# Patient Record
Sex: Female | Born: 1985 | Race: White | Hispanic: No | Marital: Married | Smoking: Never smoker
Health system: Southern US, Community
[De-identification: ages and names within clinical notes are randomized; demographics above are authoritative.]

## PROBLEM LIST (undated history)

## (undated) DIAGNOSIS — D126 Benign neoplasm of colon, unspecified: Secondary | ICD-10-CM

## (undated) DIAGNOSIS — K589 Irritable bowel syndrome without diarrhea: Secondary | ICD-10-CM

## (undated) DIAGNOSIS — N390 Urinary tract infection, site not specified: Secondary | ICD-10-CM

## (undated) DIAGNOSIS — IMO0001 Reserved for inherently not codable concepts without codable children: Secondary | ICD-10-CM

## (undated) DIAGNOSIS — K3184 Gastroparesis: Secondary | ICD-10-CM

## (undated) DIAGNOSIS — K219 Gastro-esophageal reflux disease without esophagitis: Secondary | ICD-10-CM

## (undated) DIAGNOSIS — N809 Endometriosis, unspecified: Secondary | ICD-10-CM

## (undated) DIAGNOSIS — K2970 Gastritis, unspecified, without bleeding: Secondary | ICD-10-CM

## (undated) HISTORY — DX: Gastro-esophageal reflux disease without esophagitis: K21.9

## (undated) HISTORY — DX: Irritable bowel syndrome, unspecified: K58.9

## (undated) HISTORY — PX: OTHER SURGICAL HISTORY: SHX169

## (undated) HISTORY — DX: Reserved for inherently not codable concepts without codable children: IMO0001

## (undated) HISTORY — DX: Benign neoplasm of colon, unspecified: D12.6

## (undated) HISTORY — DX: Urinary tract infection, site not specified: N39.0

---
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
## 2011-10-27 LAB — CBC WITH DIFFERENTIAL/PLATELET
HCT: 41 %
Hemoglobin: 14.2 g/dL (ref 12.0–16.0)
MCV: 86 fL
Platelets: 220 K/µL
WBC: 7.3

## 2011-10-27 LAB — COMPREHENSIVE METABOLIC PANEL
ALT: 14 U/L (ref 7–35)
AST: 17 U/L
Albumin: 4.2
BUN: 13 mg/dL (ref 4–21)
Creat: 0.65
Glucose: 89 mg/dL
Lipase: 22 units/L (ref 0–53)
Sodium: 139 mmol/L (ref 137–147)

## 2011-12-30 ENCOUNTER — Ambulatory Visit: Payer: Self-pay | Admitting: Gastroenterology

## 2012-01-04 ENCOUNTER — Ambulatory Visit (INDEPENDENT_AMBULATORY_CARE_PROVIDER_SITE_OTHER): Payer: BC Managed Care – PPO | Admitting: Gastroenterology

## 2012-01-04 ENCOUNTER — Encounter: Payer: Self-pay | Admitting: Gastroenterology

## 2012-01-04 VITALS — BP 120/81 | HR 99 | Temp 99.0°F | Ht 66.0 in | Wt 148.8 lb

## 2012-01-04 DIAGNOSIS — R197 Diarrhea, unspecified: Secondary | ICD-10-CM

## 2012-01-04 DIAGNOSIS — R1110 Vomiting, unspecified: Secondary | ICD-10-CM

## 2012-01-04 DIAGNOSIS — R634 Abnormal weight loss: Secondary | ICD-10-CM

## 2012-01-04 DIAGNOSIS — R1030 Lower abdominal pain, unspecified: Secondary | ICD-10-CM | POA: Insufficient documentation

## 2012-01-04 DIAGNOSIS — R109 Unspecified abdominal pain: Secondary | ICD-10-CM

## 2012-01-04 DIAGNOSIS — R1013 Epigastric pain: Secondary | ICD-10-CM

## 2012-01-04 LAB — CBC WITH DIFFERENTIAL/PLATELET
Basophils Relative: 0 % (ref 0–1)
Eosinophils Absolute: 0 10*3/uL (ref 0.0–0.7)
Eosinophils Relative: 1 % (ref 0–5)
Hemoglobin: 13.7 g/dL (ref 12.0–15.0)
Lymphs Abs: 2.5 10*3/uL (ref 0.7–4.0)
MCH: 29.3 pg (ref 26.0–34.0)
MCHC: 34.7 g/dL (ref 30.0–36.0)
MCV: 84.6 fL (ref 78.0–100.0)
Monocytes Absolute: 0.4 10*3/uL (ref 0.1–1.0)
Monocytes Relative: 5 % (ref 3–12)
Neutrophils Relative %: 60 % (ref 43–77)
RBC: 4.67 MIL/uL (ref 3.87–5.11)

## 2012-01-04 LAB — TSH: TSH: 2.095 u[IU]/mL (ref 0.350–4.500)

## 2012-01-04 LAB — HEPATIC FUNCTION PANEL
AST: 20 U/L (ref 0–37)
Alkaline Phosphatase: 42 U/L (ref 39–117)
Bilirubin, Direct: 0.1 mg/dL (ref 0.0–0.3)
Total Bilirubin: 0.4 mg/dL (ref 0.3–1.2)

## 2012-01-04 MED ORDER — DEXLANSOPRAZOLE 60 MG PO CPDR: 60.0000 mg | DELAYED_RELEASE_CAPSULE | Freq: Every day | ORAL | Status: DC

## 2012-01-04 MED ORDER — DICYCLOMINE HCL 10 MG PO CAPS: 10.0000 mg | ORAL_CAPSULE | Freq: Three times a day (TID) | ORAL | Status: DC

## 2012-01-04 NOTE — Progress Notes (Signed)
Faxed to PCP

## 2012-01-04 NOTE — Assessment & Plan Note (Signed)
N/V/D associated with epigastric and lower abd pain which has persisted for two months. Symptoms began about two weeks after taking Bactrim for possible UTI. Patient also with dysuria, urinary urgency, microscopic hematuria but last urine culture negative. ?interstial cystitis as tends to be present along with IBS? Encourage patient to see urologist.   Discussed with patient. Her N/V/D and abd pain may be infectious etiology (need to rule out CDiff and other pathogens), post-infectious IBS, inflammatory bowel disease. Less likely biliary. Would advise to check stool studies, ifobt. Recheck CBC with diff, LFTs, screen for Celiac.  Start Dexilant 60mg  daily. Samples provided. Stop omeprazole. Start Bentyl qac. Hold for urinary hesitancy or constipation.   Will touch base with her in two weeks and after results available. She may eventually need EGD/TCS/TI.

## 2012-01-04 NOTE — Progress Notes (Signed)
Primary Care Physician:  Ninfa Linden, NP  Primary Gastroenterologist:  Jonette Eva, MD   Chief Complaint  Patient presents with  . Nausea  . Emesis    HPI:  Hailey Parker is a 26 y.o. female here for new patient visit, self referral, for further evaluation of persistent N/V/D of two months duration.   In 09/2011, patient developed urinary urgency/frequency, dysuria. H/O multiple UTIs in past. Saw PCP. Had moderate blood in urine. Treated with Bactrim. Urine culture was negative. Also had pelvic u/s which was unremarkable. Two weeks later she developed N/V/D. Urinary symptoms persist. N/V/D lasted for two weeks. Noncontrast CT done for left adnexal pain and hematuria. CT unremarkable. PCP thought N/V/D due to IBS. When symptoms persistent, patient reevaluate and diagnosed with Norovirus. Labs showed unremarkable CBC, CMET, lipase.   Slowly been able to add back to diet but very slow. Weight down about one full size of clothing, 10 pounds. If she eats anything red (strawberries, red drinks) seems to followed by N/V. Within 5-10 minutes will vomit with certain foods (chicken, milk, soy milk, wheat bread, anything salty or sweet, ice in drinks). Tolerates [place] meat only. Doesn't tolerate other meats except occasional roast beef. White starches, [place] no diarrhea. Chronic lactose sensitive. Usually tolerates soy milk but not anymore. Tolerates bananas, room temp water, white bread, low salt chips/popcorn/pretzels, saltines.   Has h/o IBS but usually able to control with food avoidance. Has never had significant diarrhea or urgency. At baseline, BM 2 daily. Now 4-5 daily with rare solid food. No nocturnal symptoms. Used to eat salads daily, but now lettuce causes N/V/D. No blood in stool. Doesn't eat within three hours of bedtime. Omeprazole lately. Burning/burps, pain in chest seems to help some. No meds at all for diarrhea.  C/O pp lower abdominal pain.    Current Outpatient Prescriptions    Medication Sig Dispense Refill  .  omeprazole  20mg  daily prn     .       Marland Kitchen NON FORMULARY nortrell 777   As directed      . NON FORMULARY OTC allergy    One daily as needed        Allergies as of 01/04/2012 - Review Complete 01/04/2012  Allergen Reaction Noted  . Cefzil (cefprozil) Swelling 01/04/2012    Past Medical History  Diagnosis Date  . Reflux   . UTI (urinary tract infection)     Past Surgical History  Procedure Date  . Bilateral ear tubes 1993/1997  . Adenoidectomy 1997    Family History  Problem Relation Age of Onset  . Heart attack Paternal Grandfather     age 39  . Arthritis Paternal Grandmother     RA  . Inflammatory bowel disease Neg Hx     History   Social History  . Marital Status: Married    Spouse Name: N/A    Number of Children: 0  . 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 Smoking status: Never Smoker   . Smokeless tobacco: Not on file  . Alcohol Use: Yes     only one glass of beer in 2 months  . Drug Use: No  . Sexually Active: Not on file   Other Topics Concern  . Not on file   Social History Narrative   Husband, Hailey Parker, patient here.      ROS:  General: see HPI. C/O fatigue, weakness.  Eyes: Negative for vision changes.  ENT: Negative for hoarseness, difficulty swallowing , nasal congestion. CV: Negative for chest pain, angina, palpitations, dyspnea on exertion, peripheral edema.  Respiratory: Negative for dyspnea at rest, dyspnea on exertion, cough, sputum, wheezing.  GI: See history of present illness. GU:  Positive for dysuria, microscopic hematuria, urinary frequency.   MS: Negative for joint pain, low back pain.  Derm: Negative for rash or itching.  Neuro: Negative for weakness, abnormal sensation, seizure, frequent headaches, memory loss, confusion.  Psych: Negative for anxiety, depression, suicidal ideation, hallucinations.  Endo: see hpi.  Heme:  Negative for bruising or bleeding. Allergy: Negative for rash or hives.    Physical Examination:  BP 120/81  Pulse 99  Temp 99 F (37.2 C) (Tympanic)  Ht 5\' 6"  (1.676 m)  Wt 148 lb 12.8 oz (67.495 kg)  BMI 24.02 kg/m2  LMP 12/22/2011   General: Well-nourished, well-developed in no acute distress.  Head: Normocephalic, atraumatic.   Eyes: Conjunctiva pink, no icterus. Mouth: Oropharyngeal mucosa moist and pink , no lesions erythema or exudate. Neck: Supple without thyromegaly, masses, or lymphadenopathy.  Lungs: Clear to auscultation bilaterally.  Heart: Regular rate and rhythm, no murmurs rubs or gallops.  Abdomen: Bowel sounds are normal, mild epig/lower abd tenderess, nondistended, no hepatosplenomegaly or masses, no abdominal bruits or    hernia , no rebound or guarding.   Rectal: not performed Extremities: No lower extremity edema. No clubbing or deformities.  Neuro: Alert and oriented x 4 , grossly normal neurologically.  Skin: Warm and dry, no rash or jaundice.   Psych: Alert and cooperative, normal mood and affect.  Labs: Labs from 10/27/2011. White blood cell count 7300, hemoglobin 14.2, hematocrit 41.4, MCV 86, platelets 220,000, glucose 89, BUN 13, creatinine 0.65, sodium 139, potassium 4.3, calcium 8.7, albumin 4.2, total bilirubin 0.3, alkaline phosphatase 54, AST 17, ALT 14, lipase 22.  Imaging Studies:   CT of the and pelvis without contrast on 10/26/2011 negative for obstructive uropathy.  Pelvic ultrasound 10/14/2011 showed normal-appearing uterus. Right ovary appeared normal with small follicles. Left ovary cannot be visualized. Small amount of fluid in the endocervical canal.

## 2012-01-05 LAB — IGA: IgA: 248 mg/dL (ref 69–380)

## 2012-01-08 NOTE — Progress Notes (Signed)
Quick Note:  Labs look great. Remind patient about stool studies. See how she is doing? Diarrhea, vomiting, abd pain? ______

## 2012-01-11 LAB — CLOSTRIDIUM DIFFICILE BY PCR: Toxigenic C. Difficile by PCR: NOT DETECTED

## 2012-01-11 NOTE — Progress Notes (Signed)
Quick Note:  LMOM for pt to call. ( Stool studies have been done ) ______

## 2012-01-11 NOTE — Progress Notes (Signed)
Quick Note:  Pt returned call. Said she has learned what she can eat, which is mostly [place] and bread. She does very well if she sticks to that. If she does anything else she will have diarrhea about 3 times a day. No vomiting. Her abdominal pain is intermittent and mostly when she eats something different, but not very bad. Please advise! ______

## 2012-01-12 LAB — STOOL CULTURE

## 2012-01-18 NOTE — Progress Notes (Signed)
Quick Note:  Stool test all negative. EGD/TCS to be scheduled. See addendum to OV note. ______

## 2012-01-18 NOTE — Progress Notes (Signed)
LMOM to call.

## 2012-01-18 NOTE — Progress Notes (Signed)
See [facility] recommendations below.  Schedule TCS/EGD FOR ABD PAIN, VOMITING, AND DIARRHEA.

## 2012-01-18 NOTE — Progress Notes (Signed)
Called to schedule patient for procedure she advised me that she will call me back tomorrow when she can get to her calender shes at the pool and she doesn't have her calender

## 2012-01-18 NOTE — Progress Notes (Signed)
MOST LIKELY IBS, but proceed with TCS/EGD FOR ABD PAIN, VOMITING, AND DIARRHEA.

## 2012-01-25 ENCOUNTER — Encounter (HOSPITAL_COMMUNITY): Payer: Self-pay | Admitting: Pharmacy Technician

## 2012-01-25 ENCOUNTER — Other Ambulatory Visit: Payer: Self-pay | Admitting: Gastroenterology

## 2012-01-25 DIAGNOSIS — R109 Unspecified abdominal pain: Secondary | ICD-10-CM

## 2012-01-25 DIAGNOSIS — R197 Diarrhea, unspecified: Secondary | ICD-10-CM

## 2012-01-25 DIAGNOSIS — R1110 Vomiting, unspecified: Secondary | ICD-10-CM

## 2012-01-25 MED ORDER — PEG-KCL-NACL-NASULF-NA ASC-C 100 G PO SOLR: 1.0000 | Freq: Once | ORAL | Status: DC

## 2012-01-25 NOTE — Progress Notes (Signed)
Please f/u on this. Looks like she never got scheduled.

## 2012-01-25 NOTE — Progress Notes (Signed)
Hailey Parker is scheduled with [facility] on 07/30 at 12:15 instructions and prep Rx was mailed to the patient and patient is aware

## 2012-02-08 ENCOUNTER — Other Ambulatory Visit: Payer: Self-pay | Admitting: Gastroenterology

## 2012-02-08 MED ORDER — PEG-KCL-NACL-NASULF-NA ASC-C 100 G PO SOLR: 1.0000 | Freq: Once | ORAL | Status: DC

## 2012-02-09 ENCOUNTER — Encounter (HOSPITAL_COMMUNITY)
Admission: RE | Disposition: A | Discharge status: Home / Self Care | Payer: Self-pay | Source: Ambulatory Visit | Attending: Gastroenterology

## 2012-02-09 ENCOUNTER — Encounter (HOSPITAL_COMMUNITY): Payer: Self-pay | Admitting: *Deleted

## 2012-02-09 ENCOUNTER — Ambulatory Visit (HOSPITAL_COMMUNITY)
Admission: RE | Admit: 2012-02-09 | Discharge: 2012-02-09 | Disposition: A | Discharge status: Home / Self Care | Payer: BC Managed Care – PPO | Source: Ambulatory Visit | Attending: Gastroenterology | Admitting: Gastroenterology

## 2012-02-09 DIAGNOSIS — R109 Unspecified abdominal pain: Secondary | ICD-10-CM | POA: Insufficient documentation

## 2012-02-09 DIAGNOSIS — K648 Other hemorrhoids: Secondary | ICD-10-CM | POA: Insufficient documentation

## 2012-02-09 DIAGNOSIS — R634 Abnormal weight loss: Secondary | ICD-10-CM | POA: Insufficient documentation

## 2012-02-09 DIAGNOSIS — K2990 Gastroduodenitis, unspecified, without bleeding: Secondary | ICD-10-CM

## 2012-02-09 DIAGNOSIS — R112 Nausea with vomiting, unspecified: Secondary | ICD-10-CM

## 2012-02-09 DIAGNOSIS — K2970 Gastritis, unspecified, without bleeding: Secondary | ICD-10-CM

## 2012-02-09 DIAGNOSIS — R1110 Vomiting, unspecified: Secondary | ICD-10-CM

## 2012-02-09 DIAGNOSIS — R1032 Left lower quadrant pain: Secondary | ICD-10-CM

## 2012-02-09 DIAGNOSIS — R197 Diarrhea, unspecified: Secondary | ICD-10-CM

## 2012-02-09 DIAGNOSIS — D126 Benign neoplasm of colon, unspecified: Secondary | ICD-10-CM

## 2012-02-09 DIAGNOSIS — K2940 Chronic atrophic gastritis without bleeding: Secondary | ICD-10-CM | POA: Insufficient documentation

## 2012-02-09 HISTORY — PX: ESOPHAGOGASTRODUODENOSCOPY: SHX1529

## 2012-02-09 HISTORY — PX: OTHER SURGICAL HISTORY: SHX169

## 2012-02-09 SURGERY — COLONOSCOPY WITH ESOPHAGOGASTRODUODENOSCOPY (EGD)
Anesthesia: Moderate Sedation

## 2012-02-09 MED ORDER — PROMETHAZINE HCL 25 MG/ML IJ SOLN
INTRAMUSCULAR | Status: DC | PRN
  Administered 2012-02-09: 12.5 mg via INTRAVENOUS

## 2012-02-09 MED ORDER — SODIUM CHLORIDE 0.45 % IV SOLN
Freq: Once | INTRAVENOUS | Status: AC
  Administered 2012-02-09: 1000 mL via INTRAVENOUS

## 2012-02-09 MED ORDER — DEXLANSOPRAZOLE 60 MG PO CPDR: DELAYED_RELEASE_CAPSULE | ORAL | Status: DC

## 2012-02-09 MED ORDER — STERILE WATER FOR IRRIGATION IR SOLN
Status: DC | PRN
  Administered 2012-02-09: 11:00:00

## 2012-02-09 MED ORDER — SODIUM CHLORIDE 0.9 % IJ SOLN
INTRAMUSCULAR | Status: AC
  Filled 2012-02-09: qty 10

## 2012-02-09 MED ORDER — MIDAZOLAM HCL 5 MG/5ML IJ SOLN
INTRAMUSCULAR | Status: DC | PRN
  Administered 2012-02-09 (×3): 1 mg via INTRAVENOUS
  Administered 2012-02-09: 2 mg via INTRAVENOUS

## 2012-02-09 MED ORDER — MEPERIDINE HCL 100 MG/ML IJ SOLN
INTRAMUSCULAR | Status: AC
  Filled 2012-02-09: qty 2

## 2012-02-09 MED ORDER — MEPERIDINE HCL 100 MG/ML IJ SOLN
INTRAMUSCULAR | Status: DC | PRN
  Administered 2012-02-09: 50 mg via INTRAVENOUS
  Administered 2012-02-09: 25 mg via INTRAVENOUS

## 2012-02-09 MED ORDER — PROMETHAZINE HCL 25 MG/ML IJ SOLN
INTRAMUSCULAR | Status: AC
  Filled 2012-02-09: qty 1

## 2012-02-09 MED ORDER — MIDAZOLAM HCL 5 MG/5ML IJ SOLN
INTRAMUSCULAR | Status: AC
  Filled 2012-02-09: qty 10

## 2012-02-09 NOTE — H&P (Signed)
  Primary Care Physician:  Ninfa Linden, NP Primary Gastroenterologist:  Dr. Darrick Penna  Pre-Procedure History & Physical: HPI:  Hailey Parker is a 26 y.o. female here for NAUSEA/VOMITING/ABDOMINAL PAIN/DIARRHEA.   Past Medical History  Diagnosis Date  . Reflux   . UTI (urinary tract infection)     Past Surgical History  Procedure Date  . Bilateral ear tubes 1993/1997  . Adenoidectomy 1997    Prior to Admission medications   Medication Sig Start Date End Date Taking? Authorizing Provider  dicyclomine (BENTYL) 10 MG capsule Take 1 capsule (10 mg total) by mouth 3 (three) times daily before meals. 01/04/12 01/03/13 Yes Tiffany Kocher, PA  fexofenadine (ALLEGRA) 60 MG tablet Take 60 mg by mouth daily as needed.   Yes Historical Provider, MD  naproxen sodium (ANAPROX) 220 MG tablet Take 220 mg by mouth every 8 (eight) hours as needed. For pain   Yes Historical Provider, MD  norethindrone-ethinyl estradiol (TRIPHASIL,CYCLAFEM,ALYACEN) 0.5/0.75/1-35 MG-MCG tablet Take 1 tablet by mouth daily.   Yes Historical Provider, MD  dexlansoprazole (DEXILANT) 60 MG capsule Take 1 capsule (60 mg total) by mouth daily. 01/04/12 02/03/12  Tiffany Kocher, PA    Allergies as of 01/25/2012 - Review Complete 01/25/2012  Allergen Reaction Noted  . Cefzil (cefprozil) Swelling 01/04/2012    Family History  Problem Relation Age of Onset  . Heart attack Paternal Grandfather     age 16  . Arthritis Paternal Grandmother     RA  . Inflammatory bowel disease Neg Hx     History   Social History  . Marital Status: Married    Spouse Name: N/A    Number of Children: 0  . 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 Smoking status: Never Smoker   . Smokeless tobacco: Not on file  . Alcohol Use: Yes     only one glass of beer in 2 months  . Drug Use: No  . Sexually Active: Not on file   Other Topics Concern  . Not on file   Social History  Narrative   Husband, Hailey Parker, patient here.    Review of Systems: See HPI, otherwise negative ROS   Physical Exam: BP 121/76  Pulse 91  Temp 97 F (36.1 C) (Oral)  Resp 20  Ht 5\' 6"  (1.676 m)  Wt 150 lb (68.04 kg)  BMI 24.21 kg/m2  SpO2 97%  LMP 01/20/2012 General:   Alert,  pleasant and cooperative in NAD Head:  Normocephalic and atraumatic. Neck:  Supple;  Lungs:  Clear throughout to auscultation.    Heart:  Regular rate and rhythm. Abdomen:  Soft, nontender and nondistended. Normal bowel sounds, without guarding, and without rebound.   Neurologic:  Alert and  oriented x4;  grossly normal neurologically.  Impression/Plan:     NAUSEA/VOMTIING/ABDOMINAL PAIN/DIARRHEA  PLAN: TCS/EGD/bX TODAY

## 2012-02-09 NOTE — Op Note (Signed)
[facility] [address] [place], [place]  [address]  ENDOSCOPY PROCEDURE REPORT  PATIENT:  Hailey Parker, Hailey Parker  MR#:  [id number] BIRTHDATE:  03/05/1986, 25 yrs. old  GENDER:  female  ENDOSCOPIST:  Jonette Eva, MD Referred by:  Ninfa Linden, NP  PROCEDURE DATE:  02/09/2012 PROCEDURE:  EGD with biopsy, 62[address] ASA CLASS: INDICATIONS:  abd pain, nausea, vomiting, unintentional weight loss-DENIES STRESS BUT AN BE AN ANXIOUS PERSON. CAN TOLERATE BREAD.  Sx better with dicylomine  MEDICATIONS:   TCS+ Versed 1 mg IV TOPICAL ANESTHETIC:  Cetacaine Spray  DESCRIPTION OF PROCEDURE:     Physical exam was performed. Informed consent was obtained from the patient after explaining the benefits, risks, and alternatives to the procedure.  The patient was connected to the monitor and placed in the left lateral position.  Continuous oxygen was provided by nasal cannula and IV medicine administered through an indwelling cannula.  After administration of sedation, the patient's esophagus was intubated and the EC-3490Li ([id number]) and EG-[id number] ([id number]) endoscope was advanced under direct visualization to the second portion of the duodenum.  The scope was removed slowly by carefully examining the color, texture, anatomy, and integrity of the mucosa on the way out.  The patient was recovered in endoscopy and discharged home in satisfactory condition. <<PROCEDUREIMAGES>>  Mild gastritis was found & BIOPSIED VIA COLD FORCEPS. NL ESOPHAGUS & DUODENUM. BIOPSIES BTAINED VIA COLD FORCEPS TO EVALUATE FOR CELIAC SPRUE.  COMPLICATIONS:    None  ENDOSCOPIC IMPRESSION: 1) Mild gastritis 2) SYMPTOMS MOST LIKELY DUE TO IBS-D  RECOMMENDATIONS: 1) Await biopsy results DEXILANT EVERY AM LOW FAT/HIGH FIBER DIET OPV IN 3 MOS  REPEAT EXAM:  No  ______________________________ Jonette Eva, MD  CC:  n. eSIGNED:   Shephanie Romas at 02/09/2012 02:17 PM  Norina Buzzard, [phone]

## 2012-02-09 NOTE — Op Note (Signed)
[facility] [address] [place], [place]  [address]  COLONOSCOPY PROCEDURE REPORT  PATIENT:  Maguire, Killmer  MR#:  [id number] BIRTHDATE:  Nov 14, 1985, 25 yrs. old  GENDER:  female  ENDOSCOPIST:  Jonette Eva, MD REF. BY:  Ninfa Linden, NP ASSISTANT:  PROCEDURE DATE:  02/09/2012 PROCEDURE:  ILEOColonoscopy with biopsy  INDICATIONS:  abd pain-mainly in LLQ, weight loss, diarrhea, nausea. & vomiting-BETTER AFTER DICYCLOMINE  MEDICATIONS:   Demerol 75 mg IV, Versed 4 mg IV, promethazine (Phenergan) 12.5 mg IV  DESCRIPTION OF PROCEDURE:    Physical exam was performed. Informed consent was obtained from the patient after explaining the benefits, risks, and alternatives to procedure.  The patient was connected to monitor and placed in left lateral position. Continuous oxygen was provided by nasal cannula and IV medicine administered through an indwelling cannula.  After administration of sedation and rectal exam, the patient's rectum was intubated and the EC-3490Li ([id number]) colonoscope was advanced under direct visualization to the ILEUM The scope was removed slowly by carefully examining the color, texture, anatomy, and integrity mucosa on the way out.  The patient was recovered in endoscopy and discharged home in satisfactory condition. <<PROCEDUREIMAGES>>  FINDINGS:  There WAS ONE 3 MM SESSILE polyp identified and removed. in the sigmoid colon VIA COLD FORCEPS.  OTHERWISE Normal ILEUM & colonoscopy without masses, vascular ectasias, or inflammatory changes.  SMALL Internal Hemorrhoids were found. RANDOMIN BIOPSIES OBTAINED TO EVLAUATE FOR MICROSCOPIC COLITIS.  PREP QUALITY: GOOD CECAL W/D TIME:    11 minutes  COMPLICATIONS:    None  ENDOSCOPIC IMPRESSION: 1) Polyp in the sigmoid colon 2) Internal hemorrhoids  RECOMMENDATIONS: 1) Await biopsy results TCS at age 40-mother had advanced polyp  OPV IN NOV 2013  REPEAT EXAM:   No  ______________________________ Jonette Eva, MD  CC:  Ninfa Linden, NP  n. eSIGNEDDuncan Dull Alberta Lenhard at 02/09/2012 12:16 PM  Norina Buzzard, [phone]

## 2012-02-12 ENCOUNTER — Telehealth: Payer: Self-pay | Admitting: Gastroenterology

## 2012-02-12 NOTE — Telephone Encounter (Signed)
Please call pt. HER RANDOM COLON AND SMALL BOWEL BIOPSIES AR NORMAL. She had a simple adenoma removed from her colon. HER stomach Bx shows mild gastritis. HER SYMPTOMS ARE MOST LIKELY DUE TO IBS.   SHE SHOULD Continue DEXILANT & DICYCLOMINE. OPV IN 3 MOS E30 W/ LL. FOLLOW A High fiber diet. TCS in 10 YEARS.

## 2012-02-12 NOTE — Telephone Encounter (Signed)
LMOM to call back

## 2012-02-12 NOTE — Telephone Encounter (Signed)
Recall made 

## 2012-02-12 NOTE — Telephone Encounter (Signed)
Pt is aware of results with no questions

## 2012-02-17 NOTE — Telephone Encounter (Signed)
Pt is aware of OV on 10/30 with LSL and reminder to have tcs in 10 years

## 2012-04-11 ENCOUNTER — Telehealth: Payer: Self-pay | Admitting: Gastroenterology

## 2012-04-11 NOTE — Telephone Encounter (Signed)
Pt called to see if we could call in a generic for Dexilant or something else that is less expensive to [facility] in [place].

## 2012-05-10 ENCOUNTER — Encounter: Payer: Self-pay | Admitting: Gastroenterology

## 2012-05-11 ENCOUNTER — Ambulatory Visit: Payer: BC Managed Care – PPO | Admitting: Gastroenterology

## 2012-05-17 ENCOUNTER — Ambulatory Visit: Payer: BC Managed Care – PPO | Admitting: Urgent Care

## 2012-05-24 ENCOUNTER — Ambulatory Visit: Payer: BC Managed Care – PPO | Admitting: Urgent Care

## 2012-05-31 ENCOUNTER — Encounter: Payer: Self-pay | Admitting: Urgent Care

## 2012-05-31 ENCOUNTER — Ambulatory Visit (INDEPENDENT_AMBULATORY_CARE_PROVIDER_SITE_OTHER): Payer: BC Managed Care – PPO | Admitting: Urgent Care

## 2012-05-31 VITALS — BP 111/71 | HR 89 | Temp 97.6°F | Ht 66.0 in | Wt 142.4 lb

## 2012-05-31 DIAGNOSIS — D126 Benign neoplasm of colon, unspecified: Secondary | ICD-10-CM

## 2012-05-31 DIAGNOSIS — R634 Abnormal weight loss: Secondary | ICD-10-CM

## 2012-05-31 DIAGNOSIS — R1013 Epigastric pain: Secondary | ICD-10-CM

## 2012-05-31 DIAGNOSIS — K589 Irritable bowel syndrome without diarrhea: Secondary | ICD-10-CM

## 2012-05-31 MED ORDER — DICYCLOMINE HCL 10 MG PO CAPS: 10.0000 mg | ORAL_CAPSULE | Freq: Three times a day (TID) | ORAL | Status: DC

## 2012-05-31 NOTE — Patient Instructions (Addendum)
Fiber supplement of choice daily(Benefiber, fiber choice, or Metamucil) Continue probiotics daily Use Bentyl 10 mg before each meal and bedtime (4 times per day) as needed for diarrhea Continue to monitor your weight. If you continue to lose weight, you will need to be seen sooner than 6 months Otherwise office visit in 6 months with Dr. Darrick Penna Diet and Irritable Bowel Syndrome  No cure has been found for irritable bowel syndrome (IBS). Many options are available to treat the symptoms. Your caregiver will give you the best treatments available for your symptoms. He or she will also encourage you to manage stress and to make changes to your diet. You need to work with your caregiver and Registered Dietician to find the best combination of medicine, diet, counseling, and support to control your symptoms. The following are some diet suggestions. FOODS THAT MAKE IBS WORSE  Fatty foods, such as [place] fries.  Milk products, such as cheese or ice cream.  Chocolate.  Alcohol.  Caffeine (found in coffee and some sodas).  Carbonated drinks, such as soda. If certain foods cause symptoms, you should eat less of them or stop eating them. FOOD JOURNAL   Keep a journal of the foods that seem to cause distress. Write down:  What you are eating during the day and when.  What problems you are having after eating.  When the symptoms occur in relation to your meals.  What foods always make you feel badly.  Take your notes with you to your caregiver to see if you should stop eating certain foods. FOODS THAT MAKE IBS BETTER Fiber reduces IBS symptoms, especially constipation, because it makes stools soft, bulky, and easier to pass. Fiber is found in bran, bread, cereal, beans, fruit, and vegetables. Examples of foods with fiber include:  Apples.  Peaches.  Pears.  Berries.  Figs.  Broccoli, raw.  Cabbage.  Carrots.  Raw peas.  Kidney beans.  Lima beans.  Whole-grain  bread.  Whole-grain cereal. Add foods with fiber to your diet a little at a time. This will let your body get used to them. Too much fiber at once might cause gas and swelling of your abdomen. This can trigger symptoms in a person with IBS. Caregivers usually recommend a diet with enough fiber to produce soft, painless bowel movements. High fiber diets may cause gas and bloating. However, these symptoms often go away within a few weeks, as your body adjusts. In many cases, dietary fiber may lessen IBS symptoms, particularly constipation. However, it may not help pain or diarrhea. High fiber diets keep the colon mildly enlarged (distended) with the added fiber. This may help prevent spasms in the colon. Some forms of fiber also keep water in the stool, thereby preventing hard stools that are difficult to pass.  Besides telling you to eat more foods with fiber, your caregiver may also tell you to get more fiber by taking a fiber pill or drinking water mixed with a special high fiber powder. An example of this is a natural fiber laxative containing psyllium seed.  TIPS  Large meals can cause cramping and diarrhea in people with IBS. If this happens to you, try eating 4 or 5 small meals a day, or try eating less at each of your usual 3 meals. It may also help if your meals are low in fat and high in carbohydrates. Examples of carbohydrates are pasta, rice, whole-grain breads and cereals, fruits, and vegetables.  If dairy products cause your symptoms to flare  up, you can try eating less of those foods. You might be able to handle yogurt better than other dairy products, because it contains bacteria that helps with digestion. Dairy products are an important source of calcium and other nutrients. If you need to avoid dairy products, be sure to talk with a Registered Dietitian about getting these nutrients through other food sources.  Drink enough water and fluids to keep your urine clear or pale yellow. This is  important, especially if you have diarrhea. FOR MORE INFORMATION  International Foundation for Functional Gastrointestinal Disorders: www.iffgd.org  National Digestive Diseases Information Clearinghouse: digestive.StageSync.si Document Released: 09/19/2003 Document Revised: 09/21/2011 Document Reviewed: 06/06/2007 [facility] Patient Information 2013 [place], [place].

## 2012-05-31 NOTE — Progress Notes (Addendum)
Primary Care Physician:  Ninfa Linden, FNP Primary Gastroenterologist:  Dr. Jonette Eva  Chief Complaint  Patient presents with  . Follow-up    IBS    HPI:  Hailey Parker is a 26 y.o. female here for follow up for IBS.  She has been taking OTC probiotics daily cannot remember the name.  She is off dexilant now as it was too expensive.  She denies any problems with acid reflux.  Denies nausea, vomiting, dysphagia, odynophagia or anorexia.  She is a high Primary school teacher.  She has been having post-prandial urgency with diarrhea daily, but bentyl does help a lot.  She is "learning to manage her symptoms".  She tells me she has continued to lose wieght due to dietary discretions.  She can only eat organic foods, nothing out of a can.  Really no rhyme or reason to foods she can eat.  Her clothing size went from 10-6 since Spring.  She is eating more carbs due to no issues with digestion.  Meats, fruits, veggies make her sick.  We have a document weight loss of 1# per month.  He reports a good appetite.  TSH, TTG IgA normal.  Frequently sees GYN for UTI-like symptoms & urologist.  Previous work-up has included endoscopies as below, CT scan A/[facility] at [facility] or [facility], & MRI abdomen. She is taking bentyl before each meal with good results. Past Medical History  Diagnosis Date  . Reflux   . UTI (urinary tract infection)   . IBS (irritable bowel syndrome)   . Tubular adenoma of colon     Past Surgical History  Procedure Date  . Bilateral ear tubes 1993/1997  . Adenoidectomy 1997  . Ileocolonoscopy 02/09/2012    Fields:tubular adenoma removed from the sigmoid colon/Internal hemorrhoids  . Esophagogastroduodenoscopy 02/09/2012    Fields:Mild gastritis/SYMPTOMS MOST LIKELY DUE TO IBS-D    Current Outpatient Prescriptions  Medication Sig Dispense Refill  . fexofenadine (ALLEGRA) 60 MG tablet Take 60 mg by mouth daily as needed.      Aldean Ast 0.1-20 MG-MCG tablet Take 1  tablet by mouth Daily.      . naproxen sodium (ANAPROX) 220 MG tablet Take 220 mg by mouth every 8 (eight) hours as needed. For pain      . NON FORMULARY Probiotic    Once capsule daily      . dexlansoprazole (DEXILANT) 60 MG capsule Take 1 capsule (60 mg total) by mouth daily.  30 capsule  0  . dicyclomine (BENTYL) 10 MG capsule Take 1 capsule (10 mg total) by mouth 4 (four) times daily -  before meals and at bedtime.  120 capsule  5    Allergies as of 05/31/2012 - Review Complete 05/31/2012  Allergen Reaction Noted  . Cefzil (cefprozil) Swelling 01/04/2012    Review of Systems: Gen: Denies any fever, chills, sweats, anorexia, fatigue, weakness, malaise, weight loss, and sleep disorder CV: Denies chest pain, angina, palpitations, syncope, orthopnea, PND, peripheral edema, and claudication. Resp: Denies dyspnea at rest, dyspnea with exercise, cough, sputum, wheezing, coughing up blood, and pleurisy. GI: Denies vomiting blood, jaundice, and fecal incontinence.   Denies dysphagia or odynophagia. Derm: Denies rash, itching, dry skin, hives, moles, warts, or unhealing ulcers.  Psych: Denies depression, anxiety, memory loss, suicidal ideation, hallucinations, paranoia, and confusion. Heme: Denies bruising, bleeding, and enlarged lymph nodes.  Physical Exam: BP 111/71  Pulse 89  Temp 97.6 F (36.4 C) (Tympanic)  Ht 5\' 6"  (1.676 m)  Wt 142 lb 6.4 oz (64.592 kg)  BMI 22.98 kg/m2  LMP 05/10/2012 General:   Alert,  Well-developed, well-nourished, pleasant and cooperative in NAD Eyes:  Sclera clear, no icterus.   Conjunctiva pink. Mouth:  No deformity or lesions, oropharynx pink and moist. Neck:  Supple; no masses or thyromegaly. Heart:  Regular rate and rhythm; no murmurs, clicks, rubs,  or gallops. Abdomen:  Normal bowel sounds.  No bruits.  Soft, non-tender and non-distended without masses, hepatosplenomegaly or hernias noted.  No guarding or rebound tenderness.   Rectal:  Deferred.    Msk:  Symmetrical without gross deformities.  Pulses:  Normal pulses noted. Extremities:  No clubbing or edema. Neurologic:  Alert and oriented x4;  grossly normal neurologically. Skin:  Intact without significant lesions or rashes.

## 2012-06-03 ENCOUNTER — Encounter: Payer: Self-pay | Admitting: Urgent Care

## 2012-06-03 DIAGNOSIS — D126 Benign neoplasm of colon, unspecified: Secondary | ICD-10-CM | POA: Insufficient documentation

## 2012-06-03 NOTE — Progress Notes (Signed)
Faxed to PCP

## 2012-06-03 NOTE — Assessment & Plan Note (Addendum)
Hailey Parker is a pleasant 26 y.o. 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  overall she is doing much better than earlier in the year. We discussed treatment modalities and overall she seems to be doing well on probiotics and bentyl before meals and at bedtime when necessary. We also discussed the fact that there seems to be no rhyme nor reason with her food associations and I suggested that she stop limiting her dietary intake of meats, fruits, and vegetables at this point so that she can maintain her weight. Overall balanced diet would be best at this point. She is to continue to monitor her weight at home.  If she continues to lose weight, she will call us.  Add Fiber supplement of choice daily(Benefiber, fiber choice, or Metamucil) Continue probiotics daily Use Bentyl 10 mg before each meal and bedtime (4 times per day) as needed for diarrhea Continue to monitor your weight. If you continue to lose weight, you will need to be seen sooner than 6 months Otherwise office visit in 6 months with Dr. Darrick Penna

## 2012-07-04 ENCOUNTER — Telehealth: Payer: Self-pay

## 2012-07-04 MED ORDER — DICYCLOMINE HCL 10 MG PO CAPS: 10.0000 mg | ORAL_CAPSULE | Freq: Three times a day (TID) | ORAL | Status: DC

## 2012-07-04 MED ORDER — PANTOPRAZOLE SODIUM 40 MG PO TBEC: 40.0000 mg | DELAYED_RELEASE_TABLET | Freq: Every day | ORAL | Status: DC

## 2012-07-04 NOTE — Telephone Encounter (Signed)
Pt is aware.  

## 2012-07-04 NOTE — Telephone Encounter (Signed)
Please call in trial protonix 40mg  daily, #31, 0Rf Bentyl 10mg  po AC/HS up to QID prn Cramps, #120, 0RF Thanks

## 2012-07-04 NOTE — Telephone Encounter (Signed)
Called Rx's to the pharmacy to [facility] @ [phone]  ( the other number was incorrect).

## 2012-07-04 NOTE — Telephone Encounter (Signed)
Pt called and said she is in [place] for 2 weeks and needs prescriptions sent to [facility] at  [facility] at phone number [phone]. She said she needs Bentyl and the medication in place of the Dexilant (said she couldn't afford that). She doesn't know the name of the med, said she was given samples but she thinks it is the same as her husband takes, Protonix. I told her we do not have samples of Protonix and she said she did not know what it was then. She said she had a prescription for it but has not had it filled. I called [employer] and spoke to Dufur and she said nothing has been sent to them other than the Dexilant. Please advise!  Her call back number is [phone].  The pharmacy to call meds to is [facility] at [facility]    [phone]

## 2012-08-08 NOTE — Progress Notes (Signed)
REVIEWED.  

## 2012-09-07 ENCOUNTER — Ambulatory Visit (INDEPENDENT_AMBULATORY_CARE_PROVIDER_SITE_OTHER): Payer: BC Managed Care – PPO | Admitting: Gastroenterology

## 2012-09-07 ENCOUNTER — Encounter: Payer: Self-pay | Admitting: Gastroenterology

## 2012-09-07 ENCOUNTER — Other Ambulatory Visit: Payer: Self-pay | Admitting: Gastroenterology

## 2012-09-07 VITALS — BP 124/74 | HR 90 | Temp 97.6°F | Ht 65.0 in | Wt 143.6 lb

## 2012-09-07 DIAGNOSIS — R197 Diarrhea, unspecified: Secondary | ICD-10-CM

## 2012-09-07 DIAGNOSIS — R112 Nausea with vomiting, unspecified: Secondary | ICD-10-CM

## 2012-09-07 DIAGNOSIS — K589 Irritable bowel syndrome without diarrhea: Secondary | ICD-10-CM

## 2012-09-07 MED ORDER — DICYCLOMINE HCL 10 MG PO CAPS: ORAL_CAPSULE | ORAL | Status: DC

## 2012-09-07 MED ORDER — PANTOPRAZOLE SODIUM 40 MG PO TBEC: DELAYED_RELEASE_TABLET | ORAL | Status: DC

## 2012-09-07 MED ORDER — ONDANSETRON 4 MG PO TBDP: ORAL_TABLET | ORAL | Status: DC

## 2012-09-07 NOTE — Patient Instructions (Signed)
TAKE ZOFRAN 30 MINS PRIOR TO MEALS AND AT BEDTIME FOR THE NEXT 5 DAYS.  CONTINUE PROTONIX. TAKE 30 MINUTES PRIOR TO MEALS TWICE DAILY.   FOLLOW A GASTROPARESIS STEP II DIET FOR 3 DAYS THEN ADVANCE TO A STEP III.  COMPLETE THE GASTRIC EMPTYING STUDY NEXT WEEK.  INCREASE BENTYL TO 2 30 MINS BEFORE MEALS FOR THE NEXT 5 DAYS.  SUBMIT STOOL AND URINE SAMPLES.  CALL ME MON MAR 4 IF YOUR SX ARE NOT IMPROVED.   FOLLOW UP IN ONE MO.

## 2012-09-07 NOTE — Assessment & Plan Note (Addendum)
ACUTE ONSET- MOST LIKELY DUE TO VIRAL ILLNESS. DIFFERENTIAL DIAGNOSIS INCLUDES GASTROPARESIS OR INFECTIOUS GASTROENTERITIS. WEIGHT STABLE.   TAKE ZOFRAN 30 MINS PRIOR TO MEALS AND AT BEDTIME FOR THE NEXT 5 DAYS. CONTINUE PROTONIX. TAKE 30 MINUTES PRIOR TO MEALS TWICE DAILY. FOLLOW A GASTROPARESIS STEP II DIET FOR 3 DAYS THEN ADVANCE TO A STEP III. COMPLETE THE GASTRIC EMPTYING STUDY NEXT WEEK. INCREASE BENTYL TO 2 30 MINS BEFORE MEALS FOR THE NEXT 5 DAYS. SUBMIT STOOL AND URINE SAMPLES. CALL ME MON MAR 4 IF YOUR SX ARE NOT IMPROVED.  FOLLOW UP IN ONE MO.

## 2012-09-07 NOTE — Assessment & Plan Note (Addendum)
DIARRHEA PREDOMINANT-SX NOT IDEALLY CONTROLLED DUE TO VIRAL ILLNESS, LESS LIKELY INFECTIOUS GASTROENTERITIS OR UTI  TAKE ZOFRAN 30 MINS PRIOR TO MEALS AND AT BEDTIME FOR THE NEXT 5 DAYS. CONTINUE PROTONIX. TAKE 30 MINUTES PRIOR TO MEALS TWICE DAILY. FOLLOW A GASTROPARESIS STEP II DIET FOR 3 DAYS THEN ADVANCE TO A STEP III. COMPLETE THE GASTRIC EMPTYING STUDY NEXT WEEK. INCREASE BENTYL TO 2 30 MINS BEFORE MEALS FOR THE NEXT 5 DAYS. SUBMIT STOOL AND URINE SAMPLES. RETURN TO WORK MAR 4 CALL ME MON MAR 4 IF YOUR SX ARE NOT IMPROVED.  FOLLOW UP IN ONE MO.

## 2012-09-07 NOTE — Progress Notes (Signed)
Subjective:    Patient ID: Hailey Parker, female    DOB: 1985-12-03, 27 y.o.   MRN: [id number]  PCP: [name]  HPI Eats and gets sick. Water makes her sick. Mashed potatoes can keep down. SX SINCE SUN. FIRST MAJOR FLARE SINCE OCT. DOES HAVE DIARRHEA AFTER EATING. TAKING BENTYL-A LITTLE DRY MOUTH. BMs: 4-5. BAD HEARTBURN SINCE SUN. VOMITING: 2X AND STOP EATING AFTER THAT. NO MEDS FOR VOMITING BECAUSE IT MAKES HER JUST AS SICK. NO BLOOD IN STOOL, ABX, OR TRAVEL. HAS WELL WATER. PUT IN A NEW WATER SOFTENER 2 MOS AGO. NO TEST FOR UTI. NO DYSURIA OR HEMATURIA.  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   Past Medical History  Diagnosis Date  . Reflux   . UTI (urinary tract infection)   . IBS (irritable bowel syndrome)   . Tubular adenoma of colon    Past Surgical History  Procedure Laterality Date  . Bilateral ear tubes  1993/1997  . Adenoidectomy  1997  . Ileocolonoscopy  02/09/2012    Vernona Peake:tubular adenoma removed from the sigmoid colon/Internal hemorrhoids  . Esophagogastroduodenoscopy  02/09/2012    Avereigh Spainhower:Mild gastritis/SYMPTOMS MOST LIKELY DUE TO IBS-D   Allergies  Allergen Reactions  . Cefzil (Cefprozil) Swelling    Swelling of throat also    Current Outpatient Prescriptions  Medication Sig Dispense Refill  . dicyclomine (BENTYL) 10 MG capsule Take 1 capsule (10 mg total) by mouth 4 (four) times daily -  before meals and at bedtime.    . fexofenadine (ALLEGRA) 60 MG tablet Take 60 mg by mouth daily as needed.    Aldean Ast 0.1-20 MG-MCG tablet Take 1 tablet by mouth Daily.    . naproxen sodium (ANAPROX) 220 MG tablet Take 220 mg by mouth every 8 (eight) hours as needed. For pain    . NON FORMULARY Probiotic    Once capsule daily [place] [employer]   . pantoprazole (PROTONIX) 40 MG tablet Take 1 tablet (40 mg total) by mouth daily. UP TO 2X/DAY SINCE SUN   .       Family History  Problem Relation Age of Onset  . Heart attack Paternal Grandfather      age 52  . Arthritis Paternal Grandmother     RA  . Inflammatory bowel disease Neg Hx     History  Substance Use Topics  . Smoking status: Never Smoker   . Smokeless tobacco: Not on file  . Alcohol Use: Yes     Comment: only one glass of beer in 2 months      Review of Systems PER HPI OTHERWISE ALL SYSTEMS ARE NEGATIVE.     Objective:   Physical Exam  Vitals reviewed. Constitutional: She is oriented to person, place, and time. She appears well-nourished. No distress.  HENT:  Head: Normocephalic and atraumatic.  Mouth/Throat: Oropharynx is clear and moist. No oropharyngeal exudate.  Eyes: Pupils are equal, round, and reactive to light. No scleral icterus.  Neck: Normal range of motion. Neck supple.  Cardiovascular: Normal rate, regular rhythm and normal heart sounds.   Pulmonary/Chest: Effort normal and breath sounds normal. No respiratory distress.  Abdominal: Soft. Bowel sounds are normal. She exhibits no distension. There is tenderness. There is no rebound and no guarding.  MILD TTP x4   Musculoskeletal: She exhibits no edema.  Lymphadenopathy:    She has no cervical adenopathy.  Neurological: She is alert and oriented to person, place, and time.  NO FOCAL DEFICITS  Psychiatric:  FLAT AFFECT, NL MOOD           Assessment & Plan:

## 2012-09-08 ENCOUNTER — Encounter (HOSPITAL_COMMUNITY): Payer: Self-pay

## 2012-09-08 ENCOUNTER — Encounter (HOSPITAL_COMMUNITY)
Admission: RE | Admit: 2012-09-08 | Discharge: 2012-09-08 | Disposition: A | Discharge status: Home / Self Care | Payer: BC Managed Care – PPO | Source: Ambulatory Visit | Attending: Gastroenterology | Admitting: Gastroenterology

## 2012-09-08 DIAGNOSIS — R1110 Vomiting, unspecified: Secondary | ICD-10-CM | POA: Insufficient documentation

## 2012-09-08 DIAGNOSIS — K3189 Other diseases of stomach and duodenum: Secondary | ICD-10-CM | POA: Insufficient documentation

## 2012-09-08 DIAGNOSIS — R1013 Epigastric pain: Secondary | ICD-10-CM | POA: Insufficient documentation

## 2012-09-08 MED ORDER — TECHNETIUM TC 99M SULFUR COLLOID
2.0000 | Freq: Once | INTRAVENOUS | Status: AC | PRN
  Administered 2012-09-08: 2 via ORAL

## 2012-09-08 NOTE — Progress Notes (Signed)
Results faxed to PCP, appt scheduled

## 2012-09-08 NOTE — Progress Notes (Signed)
PLEASE CALL PT.  HER GASTRIC EMPYTING STUDY SHOWS DELAYED GASTRIC EMPTYING. IT MEANS HER STOMACH IS WEAK. SHE NEEDS TO BE CHECKED FOR DIABETES, THYROID PROBLEMS, AND ADRENAL PROBLEMS.  TAKE ZOFRAN 30 MINS PRIOR TO MEALS AND AT BEDTIME FOR THE NEXT 5 DAYS.  CONTINUE PROTONIX. TAKE 30 MINUTES PRIOR TO MEALS TWICE DAILY.  FOLLOW A GASTROPARESIS STEP II DIET FOR 3 DAYS THEN ADVANCE TO A STEP III.  INCREASE BENTYL TO 2 30 MINS BEFORE MEALS FOR THE NEXT 5 DAYS.  SUBMIT STOOL AND URINE SAMPLES.  CALL ME MON MAR 4 IF YOUR SX ARE NOT IMPROVED AND WE MAY NEED TO ADD REGLAN. FOLLOW UP IN ONE MO.

## 2012-09-08 NOTE — Progress Notes (Signed)
Pt returned call and was informed. New lab order faxed to [facility]. Pt will do all labs at once.

## 2012-09-08 NOTE — Addendum Note (Signed)
Addended by: [place] on: 09/08/2012 01:17 PM   Modules accepted: Orders

## 2012-09-08 NOTE — Progress Notes (Signed)
LMOM to call.

## 2012-09-09 ENCOUNTER — Telehealth: Payer: Self-pay

## 2012-09-09 NOTE — Telephone Encounter (Signed)
Noted  

## 2012-09-09 NOTE — Telephone Encounter (Signed)
T/C  From pt's father, Rich Number. Said she had GES yesterday. States she is very weak, she can hardly stand. She called them to come last night about midnight. She is not vomiting. She has not had a BM x 2 days. Just states she is so weak that she fell off of the toilet. He said he was going to take her to a doctor today somewhere, either here or somewhere else. I told him that we do not have anyone in the office seeing patients today. If he wants to call her PCP, that is up to him, but if she is so weak she can't hardly sit or stand , I would recommend taking her to the ED. He said that's probably what he would do.

## 2012-09-12 ENCOUNTER — Telehealth: Payer: Self-pay

## 2012-09-12 ENCOUNTER — Encounter (HOSPITAL_COMMUNITY): Payer: Self-pay | Admitting: *Deleted

## 2012-09-12 ENCOUNTER — Emergency Department (HOSPITAL_COMMUNITY)
Admission: EM | Admit: 2012-09-12 | Discharge: 2012-09-12 | Disposition: A | Discharge status: Home / Self Care | Payer: BC Managed Care – PPO | Attending: Emergency Medicine | Admitting: Emergency Medicine

## 2012-09-12 ENCOUNTER — Other Ambulatory Visit: Payer: Self-pay

## 2012-09-12 ENCOUNTER — Emergency Department (HOSPITAL_COMMUNITY): Payer: BC Managed Care – PPO

## 2012-09-12 DIAGNOSIS — R1032 Left lower quadrant pain: Secondary | ICD-10-CM | POA: Insufficient documentation

## 2012-09-12 DIAGNOSIS — R5381 Other malaise: Secondary | ICD-10-CM | POA: Insufficient documentation

## 2012-09-12 DIAGNOSIS — Z3202 Encounter for pregnancy test, result negative: Secondary | ICD-10-CM | POA: Insufficient documentation

## 2012-09-12 DIAGNOSIS — Z79899 Other long term (current) drug therapy: Secondary | ICD-10-CM | POA: Insufficient documentation

## 2012-09-12 DIAGNOSIS — R51 Headache: Secondary | ICD-10-CM | POA: Insufficient documentation

## 2012-09-12 DIAGNOSIS — K219 Gastro-esophageal reflux disease without esophagitis: Secondary | ICD-10-CM | POA: Insufficient documentation

## 2012-09-12 DIAGNOSIS — R110 Nausea: Secondary | ICD-10-CM | POA: Insufficient documentation

## 2012-09-12 DIAGNOSIS — Z8719 Personal history of other diseases of the digestive system: Secondary | ICD-10-CM | POA: Insufficient documentation

## 2012-09-12 DIAGNOSIS — R55 Syncope and collapse: Secondary | ICD-10-CM | POA: Insufficient documentation

## 2012-09-12 DIAGNOSIS — Z87440 Personal history of urinary (tract) infections: Secondary | ICD-10-CM | POA: Insufficient documentation

## 2012-09-12 DIAGNOSIS — K5900 Constipation, unspecified: Secondary | ICD-10-CM | POA: Insufficient documentation

## 2012-09-12 DIAGNOSIS — R42 Dizziness and giddiness: Secondary | ICD-10-CM | POA: Insufficient documentation

## 2012-09-12 HISTORY — DX: Gastritis, unspecified, without bleeding: K29.70

## 2012-09-12 HISTORY — DX: Gastroparesis: K31.84

## 2012-09-12 LAB — COMPREHENSIVE METABOLIC PANEL
ALT: 18 U/L (ref 0–35)
Alkaline Phosphatase: 58 U/L (ref 39–117)
CO2: 28 mEq/L (ref 19–32)
GFR calc Af Amer: >90 mL/min (ref >90)
GFR calc non Af Amer: >90 mL/min (ref >90)
Glucose, Bld: 89 mg/dL (ref 70–99)
Potassium: 3.8 mEq/L (ref 3.5–5.1)
Sodium: 138 mEq/L (ref 135–145)
Total Bilirubin: 0.3 mg/dL (ref 0.3–1.2)

## 2012-09-12 LAB — URINALYSIS, ROUTINE W REFLEX MICROSCOPIC
Protein, ur: NEGATIVE mg/dL
Urobilinogen, UA: 0.2 mg/dL (ref 0.0–1.0)

## 2012-09-12 LAB — CBC WITH DIFFERENTIAL/PLATELET
Basophils Absolute: 0 10*3/uL (ref 0.0–0.1)
Basophils Relative: 0 % (ref 0–1)
MCHC: 34.8 g/dL (ref 30.0–36.0)
Monocytes Absolute: 0.3 10*3/uL (ref 0.1–1.0)
Neutro Abs: 5 10*3/uL (ref 1.7–7.7)
Neutrophils Relative %: 63 % (ref 43–77)
Platelets: 234 10*3/uL (ref 150–400)
RDW: 12 % (ref 11.5–15.5)

## 2012-09-12 LAB — LACTIC ACID, PLASMA: Lactic Acid, Venous: 1.3 mmol/L (ref 0.5–2.2)

## 2012-09-12 LAB — WET PREP, GENITAL: Yeast Wet Prep HPF POC: NONE SEEN

## 2012-09-12 LAB — URINE MICROSCOPIC-ADD ON

## 2012-09-12 MED ORDER — IOHEXOL 300 MG/ML  SOLN
50.0000 mL | Freq: Once | INTRAMUSCULAR | Status: AC | PRN
  Administered 2012-09-12: 50 mL via ORAL

## 2012-09-12 MED ORDER — ONDANSETRON HCL 4 MG/2ML IJ SOLN
4.0000 mg | Freq: Once | INTRAMUSCULAR | Status: AC
  Administered 2012-09-12: 4 mg via INTRAVENOUS
  Filled 2012-09-12: qty 2

## 2012-09-12 MED ORDER — OXYCODONE-ACETAMINOPHEN 5-325 MG PO TABS
1.0000 | ORAL_TABLET | Freq: Once | ORAL | Status: AC
  Administered 2012-09-12: 1 via ORAL
  Filled 2012-09-12: qty 1

## 2012-09-12 MED ORDER — METOCLOPRAMIDE HCL 10 MG PO TABS: 10.0000 mg | ORAL_TABLET | Freq: Four times a day (QID) | ORAL | Status: DC | PRN

## 2012-09-12 MED ORDER — IOHEXOL 300 MG/ML  SOLN
100.0000 mL | Freq: Once | INTRAMUSCULAR | Status: AC | PRN
  Administered 2012-09-12: 100 mL via INTRAVENOUS

## 2012-09-12 MED ORDER — METOCLOPRAMIDE HCL 5 MG/ML IJ SOLN
10.0000 mg | Freq: Once | INTRAMUSCULAR | Status: AC
  Administered 2012-09-12: 10 mg via INTRAVENOUS
  Filled 2012-09-12: qty 2

## 2012-09-12 MED ORDER — MORPHINE SULFATE 4 MG/ML IJ SOLN
4.0000 mg | Freq: Once | INTRAMUSCULAR | Status: AC
  Administered 2012-09-12: 4 mg via INTRAVENOUS
  Filled 2012-09-12: qty 1

## 2012-09-12 MED ORDER — SODIUM CHLORIDE 0.9 % IV BOLUS (SEPSIS)
500.0000 mL | Freq: Once | INTRAVENOUS | Status: AC
  Administered 2012-09-12: 500 mL via INTRAVENOUS

## 2012-09-12 NOTE — Telephone Encounter (Signed)
Discussed w/ triage RN Burna Mortimer.

## 2012-09-12 NOTE — ED Notes (Signed)
Pt c/o abd pain, n/v that started last Sunday, has hx of IBS, gastritis, gastroparesis.

## 2012-09-12 NOTE — Telephone Encounter (Signed)
T/C from [place]. She said she has been real busy , to call pt and give first urgent slot available other than today, because of  the weather and we may be leaving early. I called and spoke to the pt's mom and  gave her the appt for the first urgent on 09/14/2012 at 11:00 AM with Lorenza Burton, NP. Told her to go to the ED, per KJ , if her pain gets severe. Mom said she needs an alternative and questions the meds that was increased when she was here for OV. Pt has been taking the Bentyl 2 tablets 30 min prior to meals as directed. Her AVS said if symptoms were not improved,  call on Mon and may need to add Reglan. Mom wants to know is there anything that can be done medication wise today. She also thinks that maybe taking the extra Bentyl is causing some of the problems. Please advise!Marland Kitchen

## 2012-09-12 NOTE — Telephone Encounter (Signed)
Discussed w/ pt, mother & father over phone.  Went to [facility] & was told everything was normal.  Pt c/o vomiting every night, started zofran & bentyl.  Can't eat & stuck in chair due to severe pain in abdomen after eating.  Pain is in LUQ, pulling & twisting, 5/10 now, 10/10 at worst. C/o constant nausea worse with eating.  BM today hard & constipated, usual 4-5 per day.  Pain not relieved with BM.  C/o profound weakness.  Pt states she had CMP, CBC, UA at [facility].  She has had 2 syncopal episodes, cannot walk on her own.  She is unsure whether u preg was checked.  I advised pt to go to ER.  Stated they are near [facility] & I advised them to use their discretion regarding weather conditions.  Pt needs to be evaluated now & our office is closed due to impending weather.  Will let ER know pt is coming & let Dr Jena Gauss know as well.

## 2012-09-12 NOTE — ED Provider Notes (Signed)
History     CSN: [id number]  Arrival date & time 09/12/12  1303   First MD Initiated Contact with Patient 09/12/12 1308      Chief Complaint  Patient presents with  . Abdominal Pain     Patient is a 27 y.o. female presenting with abdominal pain. The history is provided by the patient.  Abdominal Pain Pain location:  LLQ Pain quality: aching   Pain radiates to:  Does not radiate Pain severity:  Severe Onset quality:  Gradual Duration:  8 days Timing:  Constant Progression:  Worsening Chronicity:  Recurrent Context: eating   Relieved by:  Nothing Associated symptoms: constipation, fatigue and nausea   Associated symptoms: no chest pain, no diarrhea, no dysuria, no fever, no hematemesis, no hematuria, no vaginal discharge and no vomiting   Pt reports LLQ pain for over 8 days.  Worse with eating Reports nausea and feels constipated but she did have BM this morning but was "hard" and nonbloody She reports seen in other hospital [facility] and was given meds but still does not have answer in what is causing her symptoms.  She reports she has had these abdominal pain symptoms previously.  She reports she had brief syncopal episode while sitting on the toilet recently but no CP or SOB at this time and no injury reported  Past Medical History  Diagnosis Date  . Reflux   . UTI (urinary tract infection)   . IBS (irritable bowel syndrome)   . Tubular adenoma of colon   . Gastroparesis   . Gastritis     Past Surgical History  Procedure Laterality Date  . Bilateral ear tubes  1993/1997  . Adenoidectomy  1997  . Ileocolonoscopy  02/09/2012    Fields:tubular adenoma removed from the sigmoid colon/Internal hemorrhoids  . Esophagogastroduodenoscopy  02/09/2012    Fields:Mild gastritis/SYMPTOMS MOST LIKELY DUE TO IBS-D    Family History  Problem Relation Age of Onset  . Heart attack Paternal Grandfather     age 78  . Arthritis Paternal Grandmother     RA  . Inflammatory bowel  disease Neg Hx     History  Substance Use Topics  . Smoking status: Never Smoker   . Smokeless tobacco: Not on file  . Alcohol Use: Yes     Comment: only one glass of beer in 2 months    OB History   Grav Para Term Preterm Abortions TAB SAB Ect Mult Living                  Review of Systems  Constitutional: Positive for fatigue. Negative for fever.  Cardiovascular: Negative for chest pain.  Gastrointestinal: Positive for nausea, abdominal pain and constipation. Negative for vomiting, diarrhea and hematemesis.  Genitourinary: Negative for dysuria, hematuria and vaginal discharge.  Musculoskeletal: Negative for back pain.  Neurological: Positive for dizziness, weakness and headaches.  Psychiatric/Behavioral: Negative for agitation.  All other systems reviewed and are negative.    Allergies  Cefzil and Red dye  Home Medications   Current Outpatient Rx  Name  Route  Sig  Dispense  Refill  . EXPIRED: dexlansoprazole (DEXILANT) 60 MG capsule   Oral   Take 1 capsule (60 mg total) by mouth daily.   30 capsule   0   . dicyclomine (BENTYL) 10 MG capsule      2 PO QAC AN HS FOR 5 DAYS THEN 1 PO QACHS   124 capsule   11   . fexofenadine (ALLEGRA) 60  MG tablet   Oral   Take 60 mg by mouth daily as needed.         Aldean Ast 0.1-20 MG-MCG tablet   Oral   Take 1 tablet by mouth Daily.         . naproxen sodium (ANAPROX) 220 MG tablet   Oral   Take 220 mg by mouth every 8 (eight) hours as needed. For pain         . NON FORMULARY      Probiotic    Once capsule daily         . ondansetron (ZOFRAN ODT) 4 MG disintegrating tablet      1 PO QAC AND HS   40 tablet   0   . pantoprazole (PROTONIX) 40 MG tablet      TAKE 30 MINUTES PRIOR TO MEALS TWICE DAILY.   62 tablet   11     BP 133/78  Pulse 87  Temp(Src) 98.4 F (36.9 C) (Oral)  Ht 5\' 6"  (1.676 m)  Wt 140 lb (63.504 kg)  BMI 22.61 kg/m2  SpO2 100%  LMP 09/08/2012  Physical  Exam CONSTITUTIONAL: Well developed/well nourished HEAD: Normocephalic/atraumatic EYES: EOMI/PERRL, no icterus ENMT: Mucous membranes dry NECK: supple no meningeal signs SPINE:entire spine nontender, No bruising/crepitance/stepoffs noted to spine CV: S1/S2 noted, no murmurs/rubs/gallops noted LUNGS: Lungs are clear to auscultation bilaterally, no apparent distress ABDOMEN: soft, diffuse tenderness, tenderness is mild, no rebound or guarding GU:no cva tenderness No cmt. No adnexal tenderness/mass.  No vag bleeding or discharge.  Chaperone present NEURO: Pt is awake/alert, moves all extremitiesx4 No arm/leg drift.  No facial droop.  No past pointing.  No ataxia and she is able to ambulate on her own EXTREMITIES: pulses normal, full ROM SKIN: warm, color normal PSYCH: no abnormalities of mood noted  ED Course  Procedures (including critical care time)  Labs Reviewed  CBC WITH DIFFERENTIAL - Abnormal; Notable for the following:    Hemoglobin 15.2 (*)    All other components within normal limits  GC/CHLAMYDIA PROBE AMP  WET PREP, GENITAL  COMPREHENSIVE METABOLIC PANEL  LIPASE, BLOOD  URINALYSIS, ROUTINE W REFLEX MICROSCOPIC  LACTIC ACID, PLASMA  POCT PREGNANCY, URINE   1:50 PM I spoke to on call Dr Jena Gauss His office did instruct patient to go to ER, but does not have any specific orders for her.  He was concerned about an acute surgical process.   3:01 PM Pt with continued pain in LLQ - will obtain CT imaging.  Her abdominal tenderness is moderate Do not feel this is acute gynecologic issue given her pelvic exam 5:30 PM Ct imaging negative She feels some improvement She is ambulatory.  Reports some dizziness but no focal neuro deficits.  Reports she had HA today but similar to prior.  No vomiting.  Do not feel further workup necessary.  She did have some relief from the reglan earlier.  Will prescribe this.  Will see her GI physician later today  MDM  Nursing notes including  past medical history and social history reviewed and considered in documentation Labs/vital reviewed and considered Previous records reviewed and considered        Date: 09/12/2012  Rate: 75  Rhythm: normal sinus rhythm  QRS Axis: normal  Intervals: normal  ST/T Wave abnormalities: nonspecific ST changes  Conduction Disutrbances:none  Narrative Interpretation:   Old EKG Reviewed: none available at time of interpretation    Joya Gaskins, MD 09/12/12 1731

## 2012-09-12 NOTE — Telephone Encounter (Signed)
T/c from pt. She said she went to the ED at [facility] on Friday because it was nearer to her parents. They just gave her some morphine and Zofran and said that she was not as dehydrated as they expected. Told her to follow up here. She said she has not had a BM since Thurs. Still has intermittent pain in her left side and sometimes it gets severe. She has passed out a couple of times. She is not able to eat much, it causes the abdominal pain and cramps. She is not having any nausea, but she is taking the Zofran. She has not had her labs done, but her parents are coming this AM to take her to get the labs done at [facility]. Please advise!

## 2012-09-12 NOTE — ED Notes (Signed)
Patient and family reports multiple episodes of patient having LOC at home. EDP made aware.

## 2012-09-12 NOTE — Telephone Encounter (Signed)
Discuss with KJ; fully agree with emergent evaluation at the nearest emergency room without delay

## 2012-09-13 MED ORDER — METOCLOPRAMIDE HCL 10 MG PO TABS: ORAL_TABLET | ORAL | Status: DC

## 2012-09-13 NOTE — Addendum Note (Signed)
Addended by: [place] on: 09/13/2012 08:43 AM   Modules accepted: Orders, Medications

## 2012-09-13 NOTE — Telephone Encounter (Signed)
LMOM for a return call. ( Routing to Soledad Gerlach to schedule the appt with Dr. Alycia Rossetti)

## 2012-09-13 NOTE — Telephone Encounter (Addendum)
PLEASE CALL PT.  HER GASTRIC EMPYTING STUDY SHOWS DELAYED GASTRIC EMPTYING. IT MEANS HER STOMACH IS WEAK & she needs to modify her diet. When she is having severe nausea/vomiting she should follow a STEP II GASTROPARESIS diet. She needs to complete her blood work to be evaluated for FOR DIABETES, THYROID PROBLEMS, AND ADRENAL PROBLEMS & SUBMIT HER STOOL SAMPLES.    She should add Reglan 10 mg qac and hs. If she is not eating regularly then she should take the meds at 6a12n6p and 11p. THE SIDE EFFECTS INCLUDE  AGITATION, DRAINAGE FROM HER BREAST, INVOLUNTARY MOVEMENTS OF THE FACE, ARMS AND TORSO, OR CHANGES IN VISION.  CONTINUE  ZOFRAN 30 MINS PRIOR TO MEALS AND AT BEDTIME.  CONTINUE PROTONIX. TAKE 30 MINUTES PRIOR TO MEALS TWICE DAILY.  ADVANCE TO A STEP III GASTROPARESIS ON MAR 10.  CONTINUE BENTYL TO 2 30 MINS BEFORE MEALS.  FOLLOW NEXT TUE OR WED. PT NEEDS AN APPT ASAP TO SEE DR. KOCH AT [facility] FOR NAUSEA/VOMITING/GASTROPARESIS.

## 2012-09-13 NOTE — Telephone Encounter (Signed)
Referral has been faxed to [facility] to Dr. Alycia Rossetti and they will contact the patient with date & time

## 2012-09-14 ENCOUNTER — Ambulatory Visit (INDEPENDENT_AMBULATORY_CARE_PROVIDER_SITE_OTHER): Payer: BC Managed Care – PPO | Admitting: Urgent Care

## 2012-09-14 ENCOUNTER — Encounter: Payer: Self-pay | Admitting: Urgent Care

## 2012-09-14 ENCOUNTER — Ambulatory Visit (HOSPITAL_COMMUNITY)
Admission: RE | Admit: 2012-09-14 | Discharge: 2012-09-14 | Disposition: A | Discharge status: Home / Self Care | Payer: BC Managed Care – PPO | Source: Ambulatory Visit | Attending: Urgent Care | Admitting: Urgent Care

## 2012-09-14 VITALS — BP 118/84 | HR 98 | Temp 97.4°F | Ht 67.0 in | Wt 144.6 lb

## 2012-09-14 DIAGNOSIS — R42 Dizziness and giddiness: Secondary | ICD-10-CM | POA: Insufficient documentation

## 2012-09-14 DIAGNOSIS — H538 Other visual disturbances: Secondary | ICD-10-CM | POA: Insufficient documentation

## 2012-09-14 DIAGNOSIS — R51 Headache: Secondary | ICD-10-CM | POA: Insufficient documentation

## 2012-09-14 DIAGNOSIS — R519 Headache, unspecified: Secondary | ICD-10-CM | POA: Insufficient documentation

## 2012-09-14 MED ORDER — LINACLOTIDE 290 MCG PO CAPS: 1.0000 | ORAL_CAPSULE | Freq: Every day | ORAL | Status: DC

## 2012-09-14 NOTE — Telephone Encounter (Signed)
Results faxed to PCP 

## 2012-09-14 NOTE — Assessment & Plan Note (Addendum)
Hailey Parker is a pleasant 27 y.o. female with newly diagnosed gastroparesis.  She has had chronic nausea, vomiting, and abdominal pain with extensive workup both here and at 2 ER visits.  Suspect her nausea secondary to gastroparesis, however she is complaining of paresthesias, syncopal episode, and severe headache. For this reason, she will have a noncontrast head CT today to rule out central etiology. She has started Reglan, but has not been compliant with dosing schedule. I discussed compliance in an effort to get her symptoms under control. She has been referred to Dr. Alycia Rossetti at [facility] for further management.  Fasting cortisol, TSH, hemoglobin A1c ordered by Dr. Darrick Penna at [facility] tomorrow.   Resume gastroparesis step one diet today, May resume step 2 if nausea is better with Reglan tomorrow Begin Reglan 10 mg 30 minutes before before breakfast, lunch, dinner and bedtime Use Zofran as directed for nausea and vomiting not controlled by Reglan Continue multivitamin daily Work note until Monday

## 2012-09-14 NOTE — Telephone Encounter (Signed)
Pt has appt today with Hailey Burton, NP at 11:00 AM and her father confirmed with [name].

## 2012-09-14 NOTE — Assessment & Plan Note (Signed)
She has irritable bowel syndrome, previously diarrhea predominant on Bentyl. This week she has only had 2 bowel movements total. She has constipation and left lower part of abdominal pain. I suspect she is alternating IBS.  Trial linzess 290 micrograms daily for constipation and abdominal pain Stop Bentyl for now given constipation.

## 2012-09-14 NOTE — Progress Notes (Signed)
Quick Note:  Await labs ZO:[id number], KATHY, FNP  ______

## 2012-09-14 NOTE — Assessment & Plan Note (Signed)
Severe chronic headache with weakness, syncopal episodes, and paresthesias. She also has vertigo as well.  Cannot rule out viral labyrinthitis on top of her underlying IBS and gastroparesis.  Non-contrast CT head today

## 2012-09-14 NOTE — Progress Notes (Signed)
Primary Care Physician:  Ninfa Linden, FNP Primary Gastroenterologist:  Dr. Jonette Eva  Chief Complaint  Patient presents with  . Nausea    gastroparesis, severe headache, syncope, abdominal pain  . Emesis    HPI:  Hailey Parker is a 27 y.o. female here for follow up for severe gastroparesis, abdominal pain, headache, weakness, left-sided abdominal pain, & IBS.  She was newly dx w/ gastroparesis & started on reglan 10mg  before meals & bedtime.  She admits that she took reglan BID instead of QID as she was doing a lot of sleeping.  She has been out of work as a Runner, broadcasting/film/video for about 1 week.    Zofran helping with nausea, but not taken since ER.  Denies urinary symptoms.  She has not been tolerating Step 2 gastroparesis diet.  She has been eating Ramen noodles & even they make her feel sick.  C/o "bubbles in her stomach".  C/o left-sided abdominal pain that is intermittent.  C/o vertigo & spinning room.  She "feels drunk".  She says she cannot walk alone.  C/o headache 3/10 now.  Severe 7/10 at worst.  She states she "can't think straight, feels lightheaded, & has been in her own little world".  She reports passing out a couple times over past week.  She has had an ER work-up at both [facility] including abdominopelvic CT w/ IV contrast that did not show a cause for her pain.  (See below).  Negative U preg.  C/o tingling in fingers bilaterally.  She denies depression, anxiety or severe stress & states she has less stress now than ever.  Her husband is supportive.  She is taking Protonix 40mg  BID.  She has "horrible heartburn".  She is tolerating MVI daily.  She has been on Bentyl for the past year.  She cannot get stool samples ordered due to constipation.  C/o severe malaise & weakness.  She has not yet had TSH, fasting cortisol & Hgb A1c drawn yet.  She has had a negative TTG IGA & IgA previously.  Denies side effects from reglan.  We have faxed all her records to Dr Alycia Rossetti at  [facility].    CT A/P w/ IV contrast:  Mild fullness of the right renal collecting system could be due to recent stone passage. No urinary tract stones are identified. The study is otherwise negative.  Results for orders placed during the hospital encounter of 09/12/12 (from the past 672 hour(s))  COMPREHENSIVE METABOLIC PANEL   Collection Time    09/12/12  1:12 PM      Result Value Range   Sodium 138  135 - 145 mEq/L   Potassium 3.8  3.5 - 5.1 mEq/L   Chloride 100  96 - 112 mEq/L   CO2 28  19 - 32 mEq/L   Glucose, Bld 89  70 - 99 mg/dL   BUN 8  6 - 23 mg/dL   Creatinine, Ser 1.47  0.50 - 1.10 mg/dL   Calcium 9.6  8.4 - 82.9 mg/dL   Total Protein 8.2  6.0 - 8.3 g/dL   Albumin 4.2  3.5 - 5.2 g/dL   AST 19  0 - 37 U/L   ALT 18  0 - 35 U/L   Alkaline Phosphatase 58  39 - 117 U/L   Total Bilirubin 0.3  0.3 - 1.2 mg/dL   GFR calc non Af Amer >90  >90 mL/min   GFR calc Af Amer >90  >90 mL/min  CBC WITH DIFFERENTIAL   Collection Time    09/12/12  1:12 PM      Result Value Range   WBC 8.0  4.0 - 10.5 K/uL   RBC 5.04  3.87 - 5.11 MIL/uL   Hemoglobin 15.2 (*) 12.0 - 15.0 g/dL   HCT 16.1  09.6 - 04.5 %   MCV 86.7  78.0 - 100.0 fL   MCH 30.2  26.0 - 34.0 pg   MCHC 34.8  30.0 - 36.0 g/dL   RDW 40.9  81.1 - 91.4 %   Platelets 234  150 - 400 K/uL   Neutrophils Relative 63  43 - 77 %   Neutro Abs 5.0  1.7 - 7.7 K/uL   Lymphocytes Relative 32  12 - 46 %   Lymphs Abs 2.5  0.7 - 4.0 K/uL   Monocytes Relative 4  3 - 12 %   Monocytes Absolute 0.3  0.1 - 1.0 K/uL   Eosinophils Relative 1  0 - 5 %   Eosinophils Absolute 0.1  0.0 - 0.7 K/uL   Basophils Relative 0  0 - 1 %   Basophils Absolute 0.0  0.0 - 0.1 K/uL  LIPASE, BLOOD   Collection Time    09/12/12  1:12 PM      Result Value Range   Lipase 16  11 - 59 U/L  LACTIC ACID, PLASMA   Collection Time    09/12/12  1:12 PM      Result Value Range   Lactic Acid, Venous 1.3  0.5 - 2.2 mmol/L  URINALYSIS, ROUTINE W REFLEX MICROSCOPIC    Collection Time    09/12/12  1:36 PM      Result Value Range   Color, Urine YELLOW  YELLOW   APPearance CLEAR  CLEAR   Specific Gravity, Urine 1.010  1.005 - 1.030   pH 7.5  5.0 - 8.0   Glucose, UA NEGATIVE  NEGATIVE mg/dL   Hgb urine dipstick SMALL (*) NEGATIVE   Bilirubin Urine NEGATIVE  NEGATIVE   Ketones, ur NEGATIVE  NEGATIVE mg/dL   Protein, ur NEGATIVE  NEGATIVE mg/dL   Urobilinogen, UA 0.2  0.0 - 1.0 mg/dL   Nitrite NEGATIVE  NEGATIVE   Leukocytes, UA NEGATIVE  NEGATIVE  URINE MICROSCOPIC-ADD ON   Collection Time    09/12/12  1:36 PM      Result Value Range   Squamous Epithelial / LPF RARE  RARE   RBC / HPF 0-2  <3 RBC/hpf  POCT PREGNANCY, URINE   Collection Time    09/12/12  1:45 PM      Result Value Range   Preg Test, Ur NEGATIVE  NEGATIVE  GC/CHLAMYDIA PROBE AMP   Collection Time    09/12/12  2:44 PM      Result Value Range   CT Probe RNA NEGATIVE  NEGATIVE   GC Probe RNA NEGATIVE  NEGATIVE  WET PREP, GENITAL   Collection Time    09/12/12  2:44 PM      Result Value Range   Yeast Wet Prep HPF POC NONE SEEN  NONE SEEN   Trich, Wet Prep NONE SEEN  NONE SEEN   Clue Cells Wet Prep HPF POC NONE SEEN  NONE SEEN   WBC, Wet Prep HPF POC FEW (*) NONE SEEN   Past Medical History  Diagnosis Date  . Reflux   . UTI (urinary tract infection)   . IBS (irritable bowel syndrome)   . Tubular adenoma of colon   .  Gastroparesis   . Gastritis     Past Surgical History  Procedure Laterality Date  . Bilateral ear tubes  1993/1997  . Adenoidectomy  1997  . Ileocolonoscopy  02/09/2012    Fields:tubular adenoma removed from the sigmoid colon/Internal hemorrhoids  . Esophagogastroduodenoscopy  02/09/2012    Fields:Mild gastritis/SYMPTOMS MOST LIKELY DUE TO IBS-D    Current Outpatient Prescriptions  Medication Sig Dispense Refill  . acetaminophen (TYLENOL) 500 MG tablet Take 1,000 mg by mouth every 6 (six) hours as needed for pain.      Marland Kitchen dicyclomine (BENTYL) 10 MG  capsule 2 PO QAC AN HS FOR 5 DAYS THEN 1 PO QACHS  124 capsule  11  . dicyclomine (BENTYL) 10 MG capsule Take 10 mg by mouth 4 (four) times daily -  before meals and at bedtime. Patient had been taking 20 mg at every meal  for 5 days, patient has had 1 dose today of 20 mg, will need 2 more doses to complete the 5 day regimen      . fexofenadine (ALLEGRA) 60 MG tablet Take 60 mg by mouth daily as needed.      Aldean Ast 0.1-20 MG-MCG tablet Take 1 tablet by mouth at bedtime.       . metoCLOPramide (REGLAN) 10 MG tablet 1 PO QAC OR HS OR AT [id number] AND 11P  124 tablet  11  . pantoprazole (PROTONIX) 40 MG tablet TAKE 30 MINUTES PRIOR TO MEALS TWICE DAILY.  62 tablet  11  . Linaclotide 290 MCG CAPS Take 1 capsule by mouth daily.  10 capsule  0  . ondansetron (ZOFRAN ODT) 4 MG disintegrating tablet 1 PO QAC AND HS  40 tablet  0   No current facility-administered medications for this visit.    Allergies as of 09/14/2012 - Review Complete 09/14/2012  Allergen Reaction Noted  . Cefzil (cefprozil) Swelling 01/04/2012  . Red dye  09/08/2012    Review of Systems: Gen: see HPI CV: Denies chest pain, angina, palpitations, syncope, orthopnea, PND, peripheral edema, and claudication. Resp: Denies dyspnea at rest, dyspnea with exercise, cough, sputum, wheezing, coughing up blood, and pleurisy. GI: Denies vomiting blood, jaundice, and fecal incontinence.   Denies dysphagia or odynophagia. Derm: Denies rash, itching, dry skin, hives, moles, warts, or unhealing ulcers.  Psych: Denies depression, anxiety, memory loss, suicidal ideation, hallucinations, paranoia, and confusion. Heme: Denies bruising, bleeding, and enlarged lymph nodes.  Physical Exam: BP 122/76  Pulse 87  Temp(Src) 97.4 F (36.3 C) (Oral)  Ht 5\' 7"  (1.702 m)  Wt 144 lb 9.6 oz (65.59 kg)  BMI 22.64 kg/m2  LMP 09/08/2012 No orthostasis. General:   Alert, pleasant and cooperative in NAD.  Accompanied by a friend. Eyes:  Sclera clear, no  icterus.   Conjunctiva pink. Mouth:  No deformity or lesions, oropharynx pink and moist. Neck:  Supple; no masses or thyromegaly. Heart:  Regular rate and rhythm; no murmurs, clicks, rubs,  or gallops. Abdomen:  Normal bowel sounds.  No bruits.  Soft, non-distended without masses, hepatosplenomegaly or hernias noted.  +LLQ tenderness on light palpation, +fullness ?stool LLQ.  No guarding or rebound tenderness.   Rectal:  Deferred.  Msk:  Symmetrical without gross deformities.  Pulses:  Normal pulses noted. Extremities:  No clubbing or edema. Neurologic:  Alert and oriented x4;  grossly normal neurologically. Skin:  Intact without significant lesions or rashes.

## 2012-09-14 NOTE — Patient Instructions (Addendum)
Please get your labs 8AM tomorrow.  We will call you with results. CT scan head today. Go back to gastroparesis step one diet today, May resume step 2 if nausea is better with Reglan tomorrow Begin Reglan 10 mg 30 minutes before before breakfast, lunch, dinner and bedtime Begin linzess 290 micrograms daily for constipation and abdominal pain Used Zofran as directed for nausea and vomiting not controlled by Reglan Stop Bentyl since you are constipated Continue multivitamin daily Work note until Monday Office visit with Dr. Alycia Rossetti for further evaluation Patient is scheduled with Dr. Alycia Rossetti on July 16th, 2014 @ 10:00 am

## 2012-09-14 NOTE — Progress Notes (Signed)
Faxed to PCP

## 2012-09-15 ENCOUNTER — Telehealth: Payer: Self-pay | Admitting: Gastroenterology

## 2012-09-15 LAB — HEMOGLOBIN A1C
Hgb A1c MFr Bld: 5.4 % (ref <5.7)
Mean Plasma Glucose: 108 mg/dL (ref <117)

## 2012-09-15 LAB — TSH: TSH: 2.791 u[IU]/mL (ref 0.350–4.500)

## 2012-09-15 MED ORDER — METOCLOPRAMIDE HCL 10 MG PO TABS: ORAL_TABLET | ORAL | Status: DC

## 2012-09-15 NOTE — Telephone Encounter (Signed)
Pt seen yesterday as a urgent. She called this morning saying that the hospital gave her Reglan to take 4 x day and only has 10 pills. She wants a prescription of Reglan called in to [facility].

## 2012-09-15 NOTE — Progress Notes (Signed)
Results faxed to PCP 

## 2012-09-16 ENCOUNTER — Telehealth: Payer: Self-pay | Admitting: Gastroenterology

## 2012-09-16 NOTE — Telephone Encounter (Signed)
PLEASE CALL PT.  HER LABS AND CT SCAN ARE NORMAL.

## 2012-09-16 NOTE — Progress Notes (Signed)
Quick Note:  Please let pt know all labs look fine. Head CT is normal. Thanks  ______

## 2012-09-19 ENCOUNTER — Telehealth: Payer: Self-pay | Admitting: *Deleted

## 2012-09-19 NOTE — Telephone Encounter (Signed)
Labs and CT faxed to PCP and Dr Koch 

## 2012-09-19 NOTE — Progress Notes (Signed)
Quick Note:  Pt is aware of her results. She said she is feeling better. She is able to eat a little at the time and about every 2 hours.  She will call if she has any more problems or concern. ______

## 2012-09-19 NOTE — Progress Notes (Signed)
Quick Note:  Great! ______ 

## 2012-09-19 NOTE — Progress Notes (Signed)
Labs and CT faxed to PCP and Dr Alycia Rossetti

## 2012-09-19 NOTE — Telephone Encounter (Signed)
Pt aware of results 

## 2012-09-19 NOTE — Telephone Encounter (Signed)
Hailey Parker called this am. She would like to give you an update on her condition and also to find out if her results are available. Please call her back. Thank you.

## 2012-09-19 NOTE — Telephone Encounter (Signed)
Pt aware.

## 2012-10-06 ENCOUNTER — Telehealth: Payer: Self-pay | Admitting: Gastroenterology

## 2012-10-06 NOTE — Telephone Encounter (Signed)
Pt called today to say she is still sick as ever and we have referred her to Dr Alycia Rossetti which his next available is mid July and her appointment had been moved up to May 20 with his assistant and she does not want to wait that long and wants [place] to call and have them move it up to sooner than May ASAP. I told her I would let SF be aware and she what she will advise her to do. Please call her back at [phone]

## 2012-10-06 NOTE — Telephone Encounter (Signed)
I called pt. She said she is some better. She is able to eat some solid food. She still has a lot of nausea and takes the phenergan every 4 hours. She said she still cannot walk good by herself. Her PCP took her off Reglan because of the side effects and put her on some new medicines. She is taking:   Protonix              40 mg  Once daily Zantac                150 mg qhs Buspirone           10 mg qid Mirtazapine          15 mg qhs  She just wants to see if Dr. Darrick Penna can help her get in to see Dr. Alycia Rossetti any earlier She is aware that Dr. Darrick Penna has left for the day.

## 2012-10-10 NOTE — Telephone Encounter (Signed)
Pt called to see if Dr. Darrick Penna has called Dr. Alycia Rossetti to speed up her appt. I told her that Dr. Darrick Penna was catching up from her vacation and I will remind her and try to let pt know something tomorrow.

## 2012-10-13 NOTE — Telephone Encounter (Signed)
Called and informed pt. She will try to get it here on Mon.

## 2012-10-13 NOTE — Telephone Encounter (Signed)
PLEASE CALL PT.  SHE NEEDS TO BRING IN FMLA PAPERWORK FROM [employer]. LET HER KNOW WE CHARGE A FEE TO FILL OUT PAPERWORK.

## 2012-10-13 NOTE — Telephone Encounter (Addendum)
I SPOKE WITH [facility] [employer] SCHEDULING: [phone]. I SPOKE WITH MS. SHERRI HAMLIN. SHE HAS ENTERED IN THE REQUEST FOR THE PT'S APPT BE MOVED TO ASAP. PT HAS AN APPT FOR MAY 20. EXPLAINED TO MS. HAMLIN THAT PT DOESN'T FEEL SHE CAN WAIT UNTIL MAY 20.

## 2012-10-13 NOTE — Telephone Encounter (Signed)
Called and informed pt.  

## 2012-10-13 NOTE — Telephone Encounter (Signed)
Phone call from [place] at [facility]. They have moved the pt to 10/31/2012 at 4:00 pm. ( I called and informed pt).  She wants to know if she can have a work note. She has not been back to work since she was sick in Feb. Said she is weak and wobbly and can't stand. She said the PCP had said she would give a work note, and then said no. She has f/u appt here on 10/20/2012 with Dr. Darrick Penna. She wants the work note until her appt at [facility]. Please advise!

## 2012-10-13 NOTE — Telephone Encounter (Signed)
PLEASE CALL PT. I PLACED A REQUEST FOR HER APPT TO BE MOVED UP ASAP. I AM WAITING TO HEAR BACK FROM [facility]. PT SHOULD CALL ME IF SHE DOES NOT HEAR ANYTHING FROM THEM BY APR 9.

## 2012-10-20 ENCOUNTER — Encounter: Payer: Self-pay | Admitting: Gastroenterology

## 2012-10-20 ENCOUNTER — Ambulatory Visit (INDEPENDENT_AMBULATORY_CARE_PROVIDER_SITE_OTHER): Payer: BC Managed Care – PPO | Admitting: Gastroenterology

## 2012-10-20 VITALS — BP 115/78 | HR 107 | Temp 98.3°F | Ht 67.0 in | Wt 143.6 lb

## 2012-10-20 DIAGNOSIS — K3184 Gastroparesis: Secondary | ICD-10-CM

## 2012-10-20 DIAGNOSIS — K589 Irritable bowel syndrome without diarrhea: Secondary | ICD-10-CM

## 2012-10-20 NOTE — Progress Notes (Signed)
Subjective:    Patient ID: Hailey Parker, female    DOB: 16-Jun-1986, 27 y.o.   MRN: [id number]  PCP: Hailey Parker  HPI AS A CHILD USED TO HAVE DIARRHEA OFTEN. 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 HAD PROBLEMS TOLERATING DAIRY AS A TEENAGER. AT AGE 78 ENDED UP IN THE ER WITH ABD PAIN AND VOMITING AFTER EATING 5 SMOOTHIES ION A DAY.  SX HAVE BEEN GOING ON SINCE APR 2013. SEVERE VOMITING WAS THE FIRST THING. WATER WOULD MAKE HER THROW UP. VOMITED DAILY MULTIPLE TIMES FOR 3 WEEKS. THOUGH SHE HAD NORA VIRUS, BUT WITH IBS MADE IT WORSE. VOMITING JUN/JUL OF LAST YEAR VOMITING OFF/ON. ABDOMINAL PAIN IN LEFT SIDE ALWAYS. THEN GOT TO THE POINT THAT SHE WAS DRY HEAVES. NOTICED FAST FOOD MADE IT WORSE. WAS ABLE TO TOLERATE [place]. CHANGED HER DIET AND STARTED EATING ORGANIC AND SX: DIARRHEA, PAIN, VOMITING GOT BETTER UNTIL FEB 2014.    Last seen by me FEB 2014:  Mashed potatoes can keep down. SX SINCE SUN. FIRST MAJOR FLARE SINCE OCT. DOES HAVE DIARRHEA AFTER EATING. TAKING BENTYL-A LITTLE DRY MOUTH. BMs: 4-5. BAD HEARTBURN SINCE SUN. VOMITING: 2X AND STOP EATING AFTER THAT. ONLY TAKING LIQUIDS. NO MEDS FOR VOMITING BECAUSE IT MAKES HER JUST AS SICK. NO BLOOD IN STOOL, ABX, OR TRAVEL. HAS WELL WATER. PUT IN A NEW WATER SOFTENER 2 MOS AGO. NO TEST FOR UTI. NO DYSURIA OR HEMATURIA. 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  SINCE FEB 2014: WITH ZOFRAN HAD NO BMs. WITH LINZESS HAD WATERY STOOLS/ABD PAIN. REGLAN MADE HER HAVE LOWER ABD PAIN.  STOPPED REGLAN, LINZESS, AND BENTYL. STARTED MIRALAX/BUSPAR/PHENERGAN Q4H AND AT NIGHT. NOW RARE DIARRHEA AND NO MORE VOMITING, JUST DRY HEAVES. PAIN IN ABD FEELS LIKE IT'S BEEN WORSE SINCE MAR 2014. HAS BM QOD AND FAIRLY NORMAL. HASN'T WORKED Hailey Parker. ABOUT TO RECEIVE HER LAST WORK PAYCHECK. TAKING UP CROCHETING. SX CAN BE ASSOCIATED WITH CHEST PAIN, L FOOT GOING TO SLEEP, TINGLING IN LEG, DRY MOUTH. GENERALIZED ABD PAIN BETTER AFTER PASSING OR  BMs, BUT LLQ REMAIN UNCHANGED WITH BMs OR PASSING GAS. PAIN WORSE WITH STANDING UP.  PT DENIES FEVER, CHILLS, BRBPR, melena, diarrhea, problems swallowing, heartburn or indigestion.  Past Medical History  Diagnosis Date  . Reflux   . UTI (urinary tract infection) LAST ONE > 1 YEAR AGO  . IBS (irritable bowel syndrome)   . Tubular adenoma of colon   . Gastroparesis   . Gastritis   GYN PROBLEMS: VAGINITIS(Hailey Parker), PENDING WORKUP FOR ENDOMETRIOSIS(Hailey Parker, Hailey Parker)  Past Surgical History  Procedure Laterality Date  . Bilateral ear tubes  1993/1997  . Adenoidectomy  1997  . Ileocolonoscopy  02/09/2012    Hailey Parker:tubular adenoma removed from the sigmoid colon/Internal hemorrhoids  . Esophagogastroduodenoscopy  02/09/2012    Hailey Parker:Mild gastritis/SYMPTOMS MOST LIKELY DUE TO IBS-D   Allergies  Allergen Reactions  . Cefzil (Cefprozil) Swelling    Swelling of throat also  . Red Dye    Current Outpatient Prescriptions  Medication Sig Dispense Refill  . acetaminophen (TYLENOL) 500 MG tablet Take 1,000 mg by mouth every 6 (six) hours as needed for pain.      . busPIRone (BUSPAR) 10 MG tablet Take 10 mg QAC/QID      . LUTERA 0.1-20 MG-MCG tablet Take 1 tablet by mouth at bedtime.       . mirtazapine (REMERON) 15 MG tablet Take 15 mg by mouth at bedtime.      Marland Kitchen  pantoprazole (PROTONIX) 40 MG tablet Take 40 mg by mouth daily.      . promethazine (PHENERGAN) 25 MG tablet Take 25 mg by mouth every 4 (four) hours.  Q4 HRS WHILE AWAKE    . ranitidine (ZANTAC) 150 MG tablet Take 150 mg by mouth QHS       Family History  Problem Relation Age of Onset  . Heart attack Paternal Grandfather     age 54  . Arthritis Paternal Grandmother     RA  . Inflammatory bowel disease Neg Hx    SISTER HAS IBS. MOTHER HAS IBS/HEARTBURN.  History  Substance Use Topics  . Smoking status: Never Smoker   . Smokeless tobacco: Not on file  . Alcohol Use: Yes     Comment: only one glass of beer  in 2 months  WENT TO Hailey Parker  Review of Systems VERY LIGHT MENSTRUAL CYCLES DUE TO LUTERA. MAY HAVE NEAR NL CYCLE WITH DARK BLOODY DISCHARGE EVERY 4 MOS.   PER HPI OTHERWISE ALL SYSTEMS ARE NEGATIVE.     Objective:   Physical Exam  Vitals reviewed. Constitutional: She is oriented to person, place, and time. She appears well-nourished. No distress.  HENT:  Head: Normocephalic and atraumatic.  Mouth/Throat: Oropharynx is clear and moist. No oropharyngeal exudate.  Eyes: Pupils are equal, round, and reactive to light. No scleral icterus.  Neck: Normal range of motion. Neck supple.  Cardiovascular: Normal rate, regular rhythm and normal heart sounds.   Pulmonary/Chest: Effort normal and breath sounds normal. No respiratory distress.  Abdominal: Soft. Bowel sounds are normal. She exhibits no distension. There is tenderness. There is no rebound and no guarding.  PT REPORT SEVER TTP IN LUQ/LLQ. MILD RUQ/RLQ    Musculoskeletal: Normal range of motion. She exhibits no edema.  Lymphadenopathy:    She has no cervical adenopathy.  Neurological: She is alert and oriented to person, place, and time.  NO FOCAL DEFICITS   Psychiatric:  FLAT AFFECT, NL MOOD GOOD EYE CONTACT          Assessment & Plan:

## 2012-10-20 NOTE — Progress Notes (Signed)
Cc PCP 

## 2012-10-20 NOTE — Assessment & Plan Note (Signed)
SX CONTROLLED.  CONTINUE BUSPAR/PHENERGAN. OPV JUL 2014

## 2012-10-20 NOTE — Assessment & Plan Note (Addendum)
NOW WITH FORMED STOOL. PAIN NOT IDEALLY CONTROLLED. SX MOST LIKELY DUE TO REFRACTORY IBS. DIFFERENTIAL DIAGNOSIS INCLUDES ENDOMETRIOSIS, AND LESS LIKELY OCCULT TORSION  SEE MS. [name]. DISCUSSED CARE WITH MRS. [name]. FOLLOW UP WITH [facility] NEXT WEEK. CONSIDER GYN EVAL FOR ENDOMETRIOSIS/EX LAP. OPV JUL 2014

## 2012-10-20 NOTE — Patient Instructions (Addendum)
I SPOKE WITH MS. [name]. WE AGREE THAT SEE IN A GYN FOR EVALUATION OF ENDOMETRIOSIS/POSSIBLE EXPLORATORY LAPAROTOMY.  CONTINUE MEDS FROM MS. [name].  AGREE WITH TRYING DOMPERIDONE FOR NAUSEA AND VOMITING.  CONTINUE PROBIOTIC  CALL WITH QUESTIONS.  FOLLOW UP IN JUL 2014.  PATIENT IS SCHEDULED WITH DR HARDISON AT [facility] ON Monday April 14TH AT 2:30

## 2012-10-21 NOTE — Progress Notes (Signed)
Reminder in epic °

## 2012-12-31 NOTE — Progress Notes (Signed)
REVIEWED.  CT HEAD MAR 2014 NL TSH/HGA1C/TSH MAR 2014 NL

## 2013-01-30 ENCOUNTER — Telehealth: Payer: Self-pay | Admitting: Gastroenterology

## 2013-01-30 NOTE — Telephone Encounter (Signed)
Hailey Parker has an appointment w/[facility] on 08/20 she states she is feeling a lot better an is asking for a note to be released back to work before her follow up appointment w/[facility] on 08/20. Please advise?

## 2013-01-31 NOTE — Telephone Encounter (Signed)
PLEASE CALL PT. IF SHE NEEDS A RELEASE TO GO BACK TO WORK, SHE SHOULD GET A NOTE FROM THE GYN OR [facility] SINCE THEY HAVE SEEN HER IN THE PAST 4 MOS. OPV IN AUG.

## 2013-01-31 NOTE — Telephone Encounter (Signed)
I called pt. She said she is feeling much better. She had surgery in May for endometriosis at [facility]. She said that really helped her pain a lot. She said she went the [facility] and has another appt next week. She is requesting Dr. 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 Her appt with Dr. Darrick Penna is on 03/01/2013. ( she said that Dr. Darrick Penna just had her out of work til sometime in June). Please advise!

## 2013-01-31 NOTE — Telephone Encounter (Signed)
Called and informed pt.  

## 2013-03-01 ENCOUNTER — Ambulatory Visit (INDEPENDENT_AMBULATORY_CARE_PROVIDER_SITE_OTHER): Payer: BC Managed Care – PPO | Admitting: Gastroenterology

## 2013-03-01 ENCOUNTER — Encounter: Payer: Self-pay | Admitting: Gastroenterology

## 2013-03-01 VITALS — BP 107/66 | HR 76 | Temp 97.3°F | Ht 66.0 in | Wt 158.8 lb

## 2013-03-01 DIAGNOSIS — K589 Irritable bowel syndrome without diarrhea: Secondary | ICD-10-CM

## 2013-03-01 DIAGNOSIS — K3184 Gastroparesis: Secondary | ICD-10-CM

## 2013-03-01 NOTE — Assessment & Plan Note (Signed)
SX CONTROLLED.  SAMPLES OF AMITIZA GIVEN FOR CONSTIPATION. USE FIBER POWDER OR 1 PACKET ONCE DAILY FOR 3 DAYS THEN TWICE DAILY FOR 3 DAYS THEN THREE TIMES A DAY. AVOID HIGHER DOSES IF IT CAUSES BLOATING & GAS. DRINK WATER TO KEEP YOUR URINE LIGHT YELLOW. FOLLOW UP IN FEB 2014

## 2013-03-01 NOTE — Progress Notes (Signed)
  Subjective:    Patient ID: Hailey Parker, female    DOB: [birth year], 27 y.o.   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 On a low acid/LOW FAT/LOW FIBER. EATS A LITTLE SPINACH. NO RAW VEGETABLES. BMs: MAYBE 1-2 TIMES A WEEK. CAN'T AFFORD MED FOR BMs-AMITIZA 8 MCG. DIDN'T TRY SAMPLE. USES MIRALAX AS NEEDED. 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 HAD DIARRHEA ONE DAY WHEN HER LUPRON RAN OUT SHE SUPER PERIOD. NAUSEOUS ALL THE TIME. PHENERGAN HELPS. VOMTIING x1 IN AUG 6. HAD BRBPR AFTER A POOP. ABD PAIN OFF AND ON MILD.   PT DENIES FEVER, CHILLS, BRBPR, melena, OR problems swallowing. Heartburn or indigestion-1-2X/WEEK AND TAKES PROTONIX BID.  Past Medical History  Diagnosis Date  . Reflux   . UTI (urinary tract infection)   . IBS (irritable bowel syndrome)   . Tubular adenoma of colon   . Gastroparesis   . Gastritis    Past Surgical History  Procedure Laterality Date  . Bilateral ear tubes  1993/1997  . Adenoidectomy  1997  . Ileocolonoscopy  02/09/2012    Naseem Varden:tubular adenoma removed from the sigmoid colon/Internal hemorrhoids  . Esophagogastroduodenoscopy  02/09/2012    Jaima Janney:Mild gastritis/SYMPTOMS MOST LIKELY DUE TO IBS-D    Allergies  Allergen Reactions  . Cefzil [Cefprozil] Swelling    Swelling of throat also  . Red Dye   . Current Outpatient Prescriptions  Medication Sig Dispense Refill  . acetaminophen (TYLENOL) 500 MG tablet Take 1,000 mg by mouth every 6 (six) hours as needed for pain.      . diazepam (VALIUM) 5 MG tablet Take 5 mg by mouth every 6 (six) hours as needed for anxiety.      .        . LUPRON DEPOT 3.75 MG injection Inject 3.75 mg into the muscle every 28 (twenty-eight) days.       .        . norethindrone (AYGESTIN) 5 MG tablet Take 5 mg by mouth daily.      . pantoprazole (PROTONIX) 40 MG tablet Take 40 mg by mouth 1-2 TIMES A DAY      . Probiotic Product (PROBIOTIC DAILY PO) Take by mouth daily.      .  promethazine (PHENERGAN) 25 MG tablet Take 25 mg by mouth QID PRN       . SUMAtriptan (IMITREX) 100 MG tablet Take 100 mg by mouth every 2 (two) hours as needed for migraine.      Marland Kitchen VERAPAMIL HCL PO Take 480 mg by mouth daily.      . busPIRone (BUSPAR) 10 MG tablet Take 10 mg by mouth 3 (three) times daily.      Aldean Ast 0.1-20 MG-MCG tablet Take 1 tablet by mouth at bedtime.       .            Review of Systems     Objective:   Physical Exam        Assessment & Plan:

## 2013-03-01 NOTE — Patient Instructions (Signed)
USE AMITIZA FOR CONSTIPATION.  USE FIBER POWDER OR 1 PACKET ONCE DAILY FOR 3 DAYS THEN TWICE DAILY FOR 3 DAYS THEN THREE TIMES A DAY. AVOID HIGHER DOSES IF IT CAUSES BLOATING & GAS.  DRINK WATER TO KEEP YOUR URINE LIGHT YELLOW.  FOLLOW UP IN FEB 2014.

## 2013-03-01 NOTE — Progress Notes (Signed)
Reminder already made for 08/2013

## 2013-03-01 NOTE — Assessment & Plan Note (Signed)
SX CONTROLLED WITH DIET. GAINED 15 LBS SINCE APR 2014.

## 2013-03-02 NOTE — Progress Notes (Signed)
cc'd to pcp 

## 2013-05-02 ENCOUNTER — Telehealth: Payer: Self-pay

## 2013-05-02 NOTE — Telephone Encounter (Signed)
Sx most likely due to IBS flare.

## 2013-05-02 NOTE — Telephone Encounter (Signed)
REVIEWED.  

## 2013-05-02 NOTE — Telephone Encounter (Signed)
Pt called and said she has been doing fine until after she had her lunch today, noodles and [place]. She has been very nauseated since eating and having severe abdominal pain. She had eaten it yesterday for lunch and dinner and no problem. She called her fertility specialist and was told to go to the ED. She wanted to call and inform Dr. Darrick Penna . I told her I would make her aware.

## 2013-05-02 NOTE — Telephone Encounter (Signed)
WILL AWAIT EVALUATION.

## 2013-05-04 ENCOUNTER — Emergency Department (HOSPITAL_COMMUNITY): Payer: BC Managed Care – PPO

## 2013-05-04 ENCOUNTER — Encounter (HOSPITAL_COMMUNITY): Payer: Self-pay | Admitting: Emergency Medicine

## 2013-05-04 ENCOUNTER — Emergency Department (HOSPITAL_COMMUNITY)
Admission: EM | Admit: 2013-05-04 | Discharge: 2013-05-04 | Disposition: A | Discharge status: Home / Self Care | Payer: BC Managed Care – PPO | Attending: Emergency Medicine | Admitting: Emergency Medicine

## 2013-05-04 DIAGNOSIS — Z87440 Personal history of urinary (tract) infections: Secondary | ICD-10-CM | POA: Insufficient documentation

## 2013-05-04 DIAGNOSIS — K219 Gastro-esophageal reflux disease without esophagitis: Secondary | ICD-10-CM | POA: Insufficient documentation

## 2013-05-04 DIAGNOSIS — Z3202 Encounter for pregnancy test, result negative: Secondary | ICD-10-CM | POA: Insufficient documentation

## 2013-05-04 DIAGNOSIS — Z79899 Other long term (current) drug therapy: Secondary | ICD-10-CM | POA: Insufficient documentation

## 2013-05-04 DIAGNOSIS — K589 Irritable bowel syndrome without diarrhea: Secondary | ICD-10-CM | POA: Insufficient documentation

## 2013-05-04 DIAGNOSIS — Z8742 Personal history of other diseases of the female genital tract: Secondary | ICD-10-CM | POA: Insufficient documentation

## 2013-05-04 HISTORY — DX: Endometriosis, unspecified: N80.9

## 2013-05-04 LAB — CBC WITH DIFFERENTIAL/PLATELET
Basophils Absolute: 0 10*3/uL (ref 0.0–0.1)
Basophils Relative: 0 % (ref 0–1)
Eosinophils Absolute: 0 10*3/uL (ref 0.0–0.7)
Hemoglobin: 13.8 g/dL (ref 12.0–15.0)
MCHC: 34.8 g/dL (ref 30.0–36.0)
Neutro Abs: 6.2 10*3/uL (ref 1.7–7.7)
Neutrophils Relative %: 73 % (ref 43–77)
Platelets: 231 10*3/uL (ref 150–400)
RDW: 12.3 % (ref 11.5–15.5)

## 2013-05-04 LAB — COMPREHENSIVE METABOLIC PANEL
AST: 25 U/L (ref 0–37)
Albumin: 4.7 g/dL (ref 3.5–5.2)
Alkaline Phosphatase: 59 U/L (ref 39–117)
Chloride: 98 mEq/L (ref 96–112)
Potassium: 3.4 mEq/L — ABNORMAL LOW (ref 3.5–5.1)
Sodium: 135 mEq/L (ref 135–145)
Total Bilirubin: 0.6 mg/dL (ref 0.3–1.2)
Total Protein: 8.3 g/dL (ref 6.0–8.3)

## 2013-05-04 LAB — URINALYSIS, ROUTINE W REFLEX MICROSCOPIC
Glucose, UA: NEGATIVE mg/dL
Ketones, ur: 15 mg/dL — AB
Protein, ur: NEGATIVE mg/dL
pH: 7 (ref 5.0–8.0)

## 2013-05-04 LAB — PREGNANCY, URINE: Preg Test, Ur: NEGATIVE

## 2013-05-04 LAB — URINE MICROSCOPIC-ADD ON

## 2013-05-04 MED ORDER — ONDANSETRON HCL 4 MG/2ML IJ SOLN
4.0000 mg | Freq: Once | INTRAMUSCULAR | Status: AC
  Administered 2013-05-04: 4 mg via INTRAVENOUS
  Filled 2013-05-04: qty 2

## 2013-05-04 MED ORDER — SODIUM CHLORIDE 0.9 % IV BOLUS (SEPSIS)
1000.0000 mL | Freq: Once | INTRAVENOUS | Status: AC
  Administered 2013-05-04: 1000 mL via INTRAVENOUS

## 2013-05-04 MED ORDER — OXYCODONE-ACETAMINOPHEN 5-325 MG PO TABS
1.0000 | ORAL_TABLET | Freq: Once | ORAL | Status: AC
  Administered 2013-05-04: 1 via ORAL
  Filled 2013-05-04: qty 1

## 2013-05-04 MED ORDER — PROMETHAZINE HCL 12.5 MG PO TABS
12.5000 mg | ORAL_TABLET | Freq: Once | ORAL | Status: AC
  Administered 2013-05-04: 12.5 mg via ORAL
  Filled 2013-05-04: qty 1

## 2013-05-04 MED ORDER — ONDANSETRON HCL 4 MG PO TABS: 4.0000 mg | ORAL_TABLET | Freq: Four times a day (QID) | ORAL | Status: DC

## 2013-05-04 MED ORDER — FAMOTIDINE IN NACL 20-0.9 MG/50ML-% IV SOLN
20.0000 mg | Freq: Once | INTRAVENOUS | Status: AC
  Administered 2013-05-04: 20 mg via INTRAVENOUS
  Filled 2013-05-04: qty 50

## 2013-05-04 MED ORDER — OXYCODONE-ACETAMINOPHEN 5-325 MG PO TABS: 1.0000 | ORAL_TABLET | Freq: Four times a day (QID) | ORAL | Status: AC | PRN

## 2013-05-04 NOTE — Telephone Encounter (Signed)
PLEASE CALL PT. I REVIEWED HER LABS/XRAY FROM THE. I AGREE HER SX ARE MOST LIKELY DUE TOIBS. E SHE SHOULD MINIMIZE PSYCHOSOCIAL STRESSORS. CONTINUE AMITIZA. DRINK WATER. FOLLOW FULL LIQUID DIET FOR 3 DAYS WHEN SHE HAS AN IBS FLARE. CONTINUE FIBER SUPPLEMENTS. OPV IN DEC OR JAN E30 IBS INSTEAD OF FEB 2015.

## 2013-05-04 NOTE — Telephone Encounter (Signed)
Called and informed pt.  

## 2013-05-04 NOTE — ED Notes (Signed)
Pt c/o abd pain with n/v since Tuesday.  Reports has endometriosis and a motility problem.  LBM was Thursday.  Pt says is not unusual for her to go several days without a BM.

## 2013-05-04 NOTE — ED Provider Notes (Signed)
CSN: [id number]     Arrival date & time 05/04/13  4098 History   First MD Initiated Contact with Patient 05/04/13 [phone]     Chief Complaint  Patient presents with  . Abdominal Pain   (Consider location/radiation/quality/duration/timing/severity/associated sxs/prior Treatment) Patient is a 27 y.o. female presenting with abdominal pain. The history is provided by the patient.  Abdominal Pain Pain location:  Generalized Pain quality: aching, bloating, cramping and pressure   Pain severity:  Severe Onset quality:  Gradual Timing:  Intermittent Progression:  Worsening Chronicity:  Chronic Context comment:  Pt has hx of IBS with constipation, and motility issues. Relieved by:  Nothing Ineffective treatments:  Acetaminophen Associated symptoms: constipation and nausea   Associated symptoms: no chest pain, no cough, no dysuria, no fever, no hematuria, no shortness of breath and no vomiting     Past Medical History  Diagnosis Date  . Reflux   . UTI (urinary tract infection)   . IBS (irritable bowel syndrome)   . Tubular adenoma of colon   . Gastroparesis   . Gastritis   . Endometriosis    Past Surgical History  Procedure Laterality Date  . Bilateral ear tubes  1993/1997  . Adenoidectomy  1997  . Ileocolonoscopy  02/09/2012    Fields:tubular adenoma removed from the sigmoid colon/Internal hemorrhoids  . Esophagogastroduodenoscopy  02/09/2012    Fields:Mild gastritis/SYMPTOMS MOST LIKELY DUE TO IBS-D   Family History  Problem Relation Age of Onset  . Heart attack Paternal Grandfather     age 12  . Arthritis Paternal Grandmother     RA  . Inflammatory bowel disease Neg Hx    History  Substance Use Topics  . Smoking status: Never Smoker   . Smokeless tobacco: Not on file  . Alcohol Use: No   OB History   Grav Para Term Preterm Abortions TAB SAB Ect Mult Living                 Review of Systems  Constitutional: Negative for fever and activity change.       All ROS  Neg except as noted in HPI  HENT: Negative for nosebleeds.   Eyes: Negative for photophobia and discharge.  Respiratory: Negative for cough, shortness of breath and wheezing.   Cardiovascular: Negative for chest pain and palpitations.  Gastrointestinal: Positive for nausea, abdominal pain and constipation. Negative for vomiting and blood in stool.  Genitourinary: Negative for dysuria, frequency and hematuria.  Musculoskeletal: Negative for arthralgias, back pain and neck pain.  Skin: Negative.   Neurological: Negative for dizziness, seizures and speech difficulty.  Psychiatric/Behavioral: Negative for hallucinations and confusion.    Allergies  Cefzil and Red dye  Home Medications   Current Outpatient Rx  Name  Route  Sig  Dispense  Refill  . acetaminophen (TYLENOL) 500 MG tablet   Oral   Take 1,000 mg by mouth every 6 (six) hours as needed for pain.         . busPIRone (BUSPAR) 10 MG tablet   Oral   Take 10 mg by mouth 3 (three) times daily.         . diazepam (VALIUM) 5 MG tablet   Oral   Take 5 mg by mouth every 6 (six) hours as needed for anxiety.         . dicyclomine (BENTYL) 10 MG capsule   Oral   Take 10 mg by mouth 4 (four) times daily -  before meals and at bedtime.         Marland Kitchen  LUPRON DEPOT 3.75 MG injection   Intramuscular   Inject 3.75 mg into the muscle every 28 (twenty-eight) days.          Marland Kitchen LUTERA 0.1-20 MG-MCG tablet   Oral   Take 1 tablet by mouth at bedtime.          . mirtazapine (REMERON) 15 MG tablet   Oral   Take 15 mg by mouth at bedtime.         . norethindrone (AYGESTIN) 5 MG tablet   Oral   Take 5 mg by mouth daily.         . pantoprazole (PROTONIX) 40 MG tablet   Oral   Take 40 mg by mouth daily.         . Probiotic Product (PROBIOTIC DAILY PO)   Oral   Take by mouth daily.         . promethazine (PHENERGAN) 25 MG tablet   Oral   Take 25 mg by mouth every 4 (four) hours.         . ranitidine (ZANTAC) 150  MG tablet   Oral   Take 150 mg by mouth 2 (two) times daily.         . SUMAtriptan (IMITREX) 100 MG tablet   Oral   Take 100 mg by mouth every 2 (two) hours as needed for migraine.         Marland Kitchen VERAPAMIL HCL PO   Oral   Take 480 mg by mouth daily.          BP 92/48  Pulse 93  Temp(Src) 98.1 F (36.7 C) (Oral)  Resp 24  Ht 5\' 6"  (1.676 m)  Wt 145 lb (65.772 kg)  BMI 23.41 kg/m2  SpO2 100% Physical Exam  Nursing note and vitals reviewed. Constitutional: She is oriented to person, place, and time. She appears well-developed and well-nourished.  Non-toxic appearance. She appears ill.  HENT:  Head: Normocephalic.  Right Ear: Tympanic membrane and external ear normal.  Left Ear: Tympanic membrane and external ear normal.  Eyes: EOM and lids are normal. Pupils are equal, round, and reactive to light.  Neck: Normal range of motion. Neck supple. Carotid bruit is not present.  Cardiovascular: Normal rate, regular rhythm, normal heart sounds, intact distal pulses and normal pulses.   Pulmonary/Chest: Breath sounds normal. No respiratory distress.  Abdominal: Soft. Bowel sounds are normal. She exhibits distension. There is tenderness.  Diffuse abdominal tenderness.   Musculoskeletal: Normal range of motion.  Lymphadenopathy:       Head (right side): No submandibular adenopathy present.       Head (left side): No submandibular adenopathy present.    She has no cervical adenopathy.  Neurological: She is alert and oriented to person, place, and time. She has normal strength. No cranial nerve deficit or sensory deficit.  Skin: Skin is warm and dry.  Psychiatric: She has a normal mood and affect. Her speech is normal.    ED Course  Procedures (including critical care time) Labs Review Labs Reviewed  CBC WITH DIFFERENTIAL  COMPREHENSIVE METABOLIC PANEL  URINALYSIS, ROUTINE W REFLEX MICROSCOPIC  PREGNANCY, URINE  LIPASE, BLOOD   Imaging Review No results found.  EKG  Interpretation   None       MDM  No diagnosis found. Pt's b/p 92/48. IV fluid bolus given. Blood pressure responding well to fluids. Urine pregnancy negative. Cbc wnl. Potassium 3.4 other wise Cmet wnl. Lipase wnl. Acute abd xrays negative. Suspect pt having  exacerbation of IBS and endometriosis. Pt to see Dr Darrick Penna for additional evaluation and management.  Kathie Dike, PA-C 05/06/13 1301

## 2013-05-04 NOTE — Telephone Encounter (Signed)
PT called today and left VM that she finally went to the ED yesterday. Was told that she was having an IBS flare and to follow up with Dr. Darrick Penna. Please advise when you want to see her.

## 2013-05-05 NOTE — Telephone Encounter (Signed)
Reminder in epic °

## 2013-05-06 NOTE — ED Provider Notes (Signed)
Medical screening examination/treatment/procedure(s) were performed by non-physician practitioner and as supervising physician I was immediately available for consultation/collaboration.    Devoria Albe, MD, Armando Gang   Ward Givens, MD 05/06/13 [phone]

## 2013-05-25 ENCOUNTER — Telehealth: Payer: Self-pay | Admitting: Gastroenterology

## 2013-05-25 NOTE — Telephone Encounter (Signed)
What are her symptoms? Does she have any N/V, constipation?  Could trial Linzess 145 mcg if constipation. Looks like Amitiza was prescribed in the past.

## 2013-05-25 NOTE — Telephone Encounter (Signed)
I called pt. She said her abdominal pain never did completely subside after she was in the ED last. She has been on clear liquids, as directed by Dr. Darrick Penna to do when she has an IBS flare. She said she has had to be on them several times. I reviewed the info in Dr. Evelina Dun note of 05/04/2013. I asked her if she had psychosocial stressors in her life and she said YES. 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 I told her to keep doing the things Dr. Evelina Dun recommended. I will route the note to Dr. Darrick Penna, but she is away for a couple of days. I told her to call if she gets worse.   Routing to Gerrit Halls, NP in Dr. Evelina Dun absence, for [facility]  Or any recommendations.

## 2013-05-25 NOTE — Telephone Encounter (Signed)
Follow gastroparesis diet.  May need to trial Linzess 145 mcg. I don't think we have samples though.  Is she on a PPI? If not, let's offer samples of Prilosec.

## 2013-05-25 NOTE — Telephone Encounter (Signed)
I called pt. She said her pain is in RLQ x 1 week. She feels light she has a bubble just below her right rib, but her bloating is much worse. She is still only on clear liquids. She had samples of the Linzess 290 mcg and it has just started to kick in and she now had diarrhea. She is having a lot of nausea/ no vomiting/ but a lot of dry heaves. She is taking phenergan q 4 hours and Zofran bid. ( she has 9 more pills of the Linzess samples).

## 2013-05-25 NOTE — Telephone Encounter (Signed)
Pt called this morning to make her follow up OV with SF and is aware that it'll be on 12/17 at 3. Patient wanted me to let nurse know that she is on a clear liquid diet again.

## 2013-05-26 MED ORDER — LINACLOTIDE 145 MCG PO CAPS: 145.0000 ug | ORAL_CAPSULE | Freq: Every day | ORAL | Status: DC

## 2013-05-26 NOTE — Telephone Encounter (Signed)
RX for Linzess sent. 

## 2013-05-26 NOTE — Telephone Encounter (Signed)
Called pt and informed of the recommendations. She has Protonix and has not been taking it , but she will start it back daily. OK to send prescription for the Linzess 145 mcg to the pharmacy. Coupon mailed to pt.

## 2013-05-26 NOTE — Addendum Note (Signed)
Addended by: Tiffany Kocher on: 05/26/2013 09:25 AM   Modules accepted: Orders

## 2013-06-14 ENCOUNTER — Other Ambulatory Visit: Payer: Self-pay

## 2013-06-14 ENCOUNTER — Telehealth: Payer: Self-pay | Admitting: Gastroenterology

## 2013-06-14 ENCOUNTER — Other Ambulatory Visit: Payer: Self-pay | Admitting: Gastroenterology

## 2013-06-14 ENCOUNTER — Encounter: Payer: Self-pay | Admitting: Gastroenterology

## 2013-06-14 ENCOUNTER — Ambulatory Visit (INDEPENDENT_AMBULATORY_CARE_PROVIDER_SITE_OTHER): Payer: BC Managed Care – PPO | Admitting: Gastroenterology

## 2013-06-14 ENCOUNTER — Encounter (INDEPENDENT_AMBULATORY_CARE_PROVIDER_SITE_OTHER): Payer: Self-pay

## 2013-06-14 VITALS — BP 123/80 | HR 104 | Temp 97.9°F | Ht 66.0 in | Wt 140.0 lb

## 2013-06-14 DIAGNOSIS — R112 Nausea with vomiting, unspecified: Secondary | ICD-10-CM

## 2013-06-14 DIAGNOSIS — R109 Unspecified abdominal pain: Secondary | ICD-10-CM

## 2013-06-14 DIAGNOSIS — K589 Irritable bowel syndrome without diarrhea: Secondary | ICD-10-CM

## 2013-06-14 DIAGNOSIS — R634 Abnormal weight loss: Secondary | ICD-10-CM

## 2013-06-14 DIAGNOSIS — K3184 Gastroparesis: Secondary | ICD-10-CM

## 2013-06-14 MED ORDER — PROMETHAZINE HCL 25 MG RE SUPP: 25.0000 mg | Freq: Four times a day (QID) | RECTAL | Status: DC | PRN

## 2013-06-14 MED ORDER — ONDANSETRON 4 MG PO TBDP: 4.0000 mg | ORAL_TABLET | Freq: Four times a day (QID) | ORAL | Status: DC | PRN

## 2013-06-14 NOTE — Progress Notes (Signed)
Primary Care Physician: Provider Not In System  Primary Gastroenterologist:  Jonette Eva, MD   Chief Complaint  Patient presents with  . Abdominal Pain  . Nausea  . Emesis    a couple of times a week    HPI: Hailey Parker is a 27 y.o. female here for followup of abdominal pain, nausea/vomiting, constipation. She has a history of gastroparesis, GERD, abdominal pain, IBS.  Last seen in 02/2013 by Dr. Darrick Penna and was doing fairly well at that time. States she had good improvement in her abdominal pain, n/v after diagnostic lap for endometriosis in 11/2012.  Initially seen in consultation by Dr. Redgie Grayer in 10/2012 at [facility]. Mildly low 2 hour gastric retention. Four hour GES normal. our hour. Cortisol level of 4.6 collected at 4pm nonfasting and unlikely unremarkable. Seen in f/u 01/2013 thought to have constipation variant IBS for etiology of symptoms.  Supposed to go back in 06/2013. Not real happy with management from [facility].   Patient states she has felt really bad for six weeks. Consuming 700 calories per day. Mashed potato, white rice, pasta, 1-2 ounces of meat per day. Mostly carbs. Protein powder at times but makes constipation worse. Vomits with plain water. Flavored water stays down better. Apple juice. Vomiting three days a week. Day of vomiting, no intake possible. Out of work a lot. Has missed 10+ days of work since second week of November. Linzess only on weekends because helps "too much". Miralax most days. Constipation more dominant. Pain in ruq with meals. Constipation with LLQ pain. Ripping pain in center of abdomen to back at times. Had real bad night last night. Similar evenings more frequent. Doesn't recall anything after 6pm last night. Cooked 7 pounds of deer meat and fell several times and doesn't recall any of it. Eight similar episodes since 02/2013. Several times per week of passing out. Has seen headache specialist/neurologist, Dr. Charolotte Eke in [place]. 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 Referred to Dr. Renaldo Fiddler by ENT who agreed with diagnosis and started her on Nortriptyline 05/26/13 with goal of 5mg  per day. Recently started on Celexa per PCP Dr. Unknown Foley. 11/2012 surgery by Dr. Delorse Limber for endometriosis helped for awhile.    Takes phernergan all the time. Takes Melatonin at night time. Worried because all her symptoms are worse after standing for long time at work. Knees turn red and burn like fire. Teeth chatter at night per husband. No fever. Feels like getting back to severe symptoms as earlier this year when she could not even walk due to severe abd pain and syncope. Afraid to drive. Afraid to work. Worried symptoms will happen at work. 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    GES 08/2012 at [facility]: 94% retention at 60 mins, 47% of retention at 120 minutes (normal less than 30% at 120 mins) GES 11/2012 at [facility]: 61% retention at 60 mins, 26% retention at 120 mins, 7% retention at 180 mins (Normal.) GES 05/2013 at [facility]: 73% retention at 60 mins, 12% retention at 120 mins (normal)  CT A/P with contrast 09/2012: Mild fullness of the right renal collecting system could be due to  recent stone passage. No urinary tract stones are identified. The study is otherwise negative.  Head CT unremarkable 09/2012.  Extensive review of medical records from local, [facility], [place], [place] as outlined above. Past Surgical History  Procedure Laterality Date  .  Bilateral ear tubes  1993/1997  . Adenoidectomy  1997  . Ileocolonoscopy  02/09/2012    Fields:tubular adenoma removed from the sigmoid colon/Internal hemorrhoids. random colon bx negative.  . Esophagogastroduodenoscopy  02/09/2012    Fields:Mild gastritis/SYMPTOMS MOST LIKELY DUE TO IBS-D. SB bx negative.     Current Outpatient Prescriptions  Medication Sig Dispense Refill  . acetaminophen (TYLENOL) 500 MG tablet Take 1,000 mg  by mouth every 6 (six) hours as needed for pain.      . citalopram (CELEXA) 10 MG tablet Take 10 mg by mouth daily. 1/2 tablet daily      . diazepam (VALIUM) 5 MG tablet Take 5 mg by mouth every 6 (six) hours as needed for anxiety.      Clelia Schaumann Estrad 91-Day (AMETHIA LO PO) Take by mouth daily.      . Linaclotide (LINZESS) 145 MCG CAPS capsule Take 1 capsule (145 mcg total) by mouth daily.  30 capsule  11  . nortriptyline (PAMELOR) 10 MG capsule Take 10 mg by mouth 3 (three) times daily.      Marland Kitchen oxyCODONE-acetaminophen (PERCOCET/ROXICET) 5-325 MG per tablet Take 1 tablet by mouth every 6 (six) hours as needed for pain.  20 tablet  0  . pantoprazole (PROTONIX) 40 MG tablet Take 40 mg by mouth daily.      . Probiotic Product (PROBIOTIC DAILY PO) Take by mouth daily.      . promethazine (PHENERGAN) 25 MG tablet Take 25 mg by mouth every 4 (four) hours.      . SUMAtriptan (IMITREX) 100 MG tablet Take 100 mg by mouth every 2 (two) hours as needed for migraine.       No current facility-administered medications for this visit.    Allergies as of 06/14/2013 - Review Complete 06/14/2013  Allergen Reaction Noted  . Cefzil [cefprozil] Swelling 01/04/2012  . Red dye  09/08/2012    ROS:  General: see HPI ENT: Negative for hoarseness, difficulty swallowing, nasal congestion. CV: Negative for chest pain, angina, palpitations, dyspnea on exertion, peripheral edema.  Respiratory: Negative for dyspnea at rest, dyspnea on exertion, cough, sputum, wheezing.  GI: See history of present illness. GU:  Negative for dysuria, hematuria, urinary incontinence, urinary frequency, nocturnal urination.  Endo: see hpi  Physical Examination:   BP 123/80  Pulse 104  Temp(Src) 97.9 F (36.6 C) (Oral)  Ht 5\' 6"  (1.676 m)  Wt 140 lb (63.504 kg)  BMI 22.61 kg/m2  General: Well-developed, tearful, in no acute distress. Accompanied by friend. Eyes: No icterus. Mouth: Oropharyngeal mucosa moist and pink ,  no lesions erythema or exudate. Lungs: Clear to auscultation bilaterally.  Heart: Regular rate and rhythm, no murmurs rubs or gallops.  Abdomen: Bowel sounds are normal, mild ruq/llq tenderness, nondistended, no hepatosplenomegaly or masses, no abdominal bruits or hernia , no rebound or guarding.   Extremities: No lower extremity edema. No clubbing or deformities. Neuro: Alert and oriented x 4   Skin: Warm and dry, no jaundice.   Psych: Alert and cooperative, normal mood and affect.  Labs:  Lab Results  Component Value Date   WBC 8.4 05/04/2013   HGB 13.8 05/04/2013   HCT 39.7 05/04/2013   MCV 86.7 05/04/2013   PLT 231 05/04/2013   Lab Results  Component Value Date   CREATININE 0.85 05/04/2013   BUN 9 05/04/2013   NA 135 05/04/2013   K 3.4* 05/04/2013   CL 98 05/04/2013   CO2 21 05/04/2013   Lab Results  Component Value Date   ALT 24 05/04/2013   AST 25 05/04/2013   ALKPHOS 59 05/04/2013   BILITOT 0.6 05/04/2013   Lab Results  Component Value Date   LIPASE 23 05/04/2013    Imaging Studies: No results found.

## 2013-06-14 NOTE — Telephone Encounter (Signed)
Please let patient know: records reviewed. We need to work up for gallbladder disease.  Let's get current LFTs, lipase, CBC, met-7. Abd u/s, if negative HIDA with [facility] (see if can coordinate the same day if needed) Does she have phenergan suppositories or Zofran sublingual if needed for uncontrollable N/V.

## 2013-06-14 NOTE — Telephone Encounter (Signed)
Sent rxs.

## 2013-06-14 NOTE — Patient Instructions (Signed)
1. I will review records from Dr. Renaldo Fiddler, Dr. Merry Proud, and [facility] 2. Referral to allergist.  3. Further recommendations to follow.

## 2013-06-14 NOTE — Assessment & Plan Note (Addendum)
27 year old female with greater than one year history of intermittent and at times refractory/debilitating nausea vomiting, right upper quadrant pain. 18 pound weight loss since August. Extensive evaluation as outlined above. Initial gastric emptying study abnormal locally. Subsequent gastric emptying studies at [facility] and [facility] unremarkable. Also with significant migraine history, endometriosis status post laparoscopic surgery earlier this year, alternating constipation and diarrhea felt to be due to IBS. Consider biliary etiology given recent unremarkable gastric emptying studies. Suspect overlay with migraine symptoms as well. She has concerning features with memory lapse, syncope, joint redness/burning of both knees of unclear etiology. She appears to have at least situational depression. It will be interesting to see if GI symptoms settle down with nortriptyline and celexa. Further recommendations to follow.  She did inquire about being taken out of work temporarily due to recent progression of syncope, pain, vomiting. She also requested allergy testing for food allergies which is reasonable.

## 2013-06-14 NOTE — Telephone Encounter (Signed)
Called pt and and got cut off, bad signal on her end.

## 2013-06-14 NOTE — Telephone Encounter (Signed)
Called and informed pt. Lab orders faxed to [facility] and she will go tomorrow. She does not have the phenergan suppositories or the Zofran sublingual  Please send Rx to [facility]

## 2013-06-15 ENCOUNTER — Other Ambulatory Visit: Payer: Self-pay | Admitting: Gastroenterology

## 2013-06-15 DIAGNOSIS — R112 Nausea with vomiting, unspecified: Secondary | ICD-10-CM

## 2013-06-15 LAB — CBC WITH DIFFERENTIAL/PLATELET
Eosinophils Relative: 0 % (ref 0–5)
HCT: 40.3 % (ref 36.0–46.0)
Hemoglobin: 13.9 g/dL (ref 12.0–15.0)
Lymphocytes Relative: 33 % (ref 12–46)
Lymphs Abs: 2.4 10*3/uL (ref 0.7–4.0)
MCV: 86.7 fL (ref 78.0–100.0)
Monocytes Absolute: 0.3 10*3/uL (ref 0.1–1.0)
Monocytes Relative: 4 % (ref 3–12)
Neutro Abs: 4.5 10*3/uL (ref 1.7–7.7)
Platelets: 277 10*3/uL (ref 150–400)
RBC: 4.65 MIL/uL (ref 3.87–5.11)
WBC: 7.3 10*3/uL (ref 4.0–10.5)

## 2013-06-15 LAB — HEPATIC FUNCTION PANEL
ALT: 19 U/L (ref 0–35)
Bilirubin, Direct: 0.1 mg/dL (ref 0.0–0.3)
Indirect Bilirubin: 0.5 mg/dL (ref 0.0–0.9)
Total Bilirubin: 0.6 mg/dL (ref 0.3–1.2)

## 2013-06-15 LAB — BASIC METABOLIC PANEL
BUN: 9 mg/dL (ref 6–23)
CO2: 26 mEq/L (ref 19–32)
Chloride: 102 mEq/L (ref 96–112)
Creat: 0.8 mg/dL (ref 0.50–1.10)
Glucose, Bld: 76 mg/dL (ref 70–99)
Potassium: 4.2 mEq/L (ref 3.5–5.3)

## 2013-06-15 LAB — LIPASE: Lipase: 11 U/L (ref 0–75)

## 2013-06-15 NOTE — Telephone Encounter (Signed)
HIDA Scan and Abd U/S scheduled for Tuesday Dec 9th at 7:30 am and she is aware

## 2013-06-15 NOTE — Progress Notes (Signed)
No pcp

## 2013-06-15 NOTE — Telephone Encounter (Signed)
Rx printed and I faxed to [facility] this AM.

## 2013-06-20 ENCOUNTER — Encounter (HOSPITAL_COMMUNITY): Payer: Self-pay

## 2013-06-20 ENCOUNTER — Ambulatory Visit (HOSPITAL_COMMUNITY)
Admission: RE | Admit: 2013-06-20 | Discharge: 2013-06-20 | Disposition: A | Discharge status: Home / Self Care | Payer: BC Managed Care – PPO | Source: Ambulatory Visit | Attending: Gastroenterology | Admitting: Gastroenterology

## 2013-06-20 ENCOUNTER — Encounter (HOSPITAL_COMMUNITY)
Admission: RE | Admit: 2013-06-20 | Discharge: 2013-06-20 | Disposition: A | Discharge status: Home / Self Care | Payer: BC Managed Care – PPO | Source: Ambulatory Visit | Attending: Gastroenterology | Admitting: Gastroenterology

## 2013-06-20 DIAGNOSIS — R112 Nausea with vomiting, unspecified: Secondary | ICD-10-CM

## 2013-06-20 DIAGNOSIS — R932 Abnormal findings on diagnostic imaging of liver and biliary tract: Secondary | ICD-10-CM | POA: Insufficient documentation

## 2013-06-20 MED ORDER — TECHNETIUM TC 99M MEBROFENIN IV KIT
5.0000 | PACK | Freq: Once | INTRAVENOUS | Status: AC | PRN
  Administered 2013-06-20: 5 via INTRAVENOUS

## 2013-06-20 MED ORDER — SINCALIDE 5 MCG IJ SOLR
INTRAMUSCULAR | Status: AC
  Administered 2013-06-20: 1.27 ug
  Filled 2013-06-20: qty 5

## 2013-06-20 MED ORDER — STERILE WATER FOR INJECTION IJ SOLN
INTRAMUSCULAR | Status: AC
  Filled 2013-06-20: qty 10

## 2013-06-21 NOTE — Progress Notes (Signed)
Quick Note:  Called and informed pt. ______ 

## 2013-06-28 ENCOUNTER — Encounter: Payer: Self-pay | Admitting: Gastroenterology

## 2013-06-28 ENCOUNTER — Ambulatory Visit (INDEPENDENT_AMBULATORY_CARE_PROVIDER_SITE_OTHER): Payer: BC Managed Care – PPO | Admitting: Gastroenterology

## 2013-06-28 VITALS — BP 112/78 | HR 118 | Temp 96.7°F | Ht 66.0 in | Wt 140.4 lb

## 2013-06-28 DIAGNOSIS — R112 Nausea with vomiting, unspecified: Secondary | ICD-10-CM

## 2013-06-28 DIAGNOSIS — K589 Irritable bowel syndrome without diarrhea: Secondary | ICD-10-CM

## 2013-06-28 NOTE — Progress Notes (Signed)
Subjective:    Patient ID: Hailey Parker, female    DOB: [birth year], 27 y.o.   MRN: [id number] Provider Not In System [place] [facility]  HPI Eating causing abd pain. NAUSEA: NEVER GOES AWAY. BEEN SEEN AT [facility] AND [facility]. HAVEN'T BEEN PLEASED WITH [facility]. [facility] PUT HER ON A NEW MEDICATION. PASSING OUT FROM PAIN FROM MIGRAINES AND LOW BLOOD PRESSURE. VOMITING: HARDLY EVER. DRY HEAVES A LOT.HAS HAs ALL THE TIME. HUSBAND FEELS IT CAUSES HER STOMACH ISSUES BUT NOT ALL OF IT. FOLLOWING GASTROPARESIS DIET. MAY BE HIT OR MISS WITH FOOD. ALLERGY TESTING PENDING. BMs: 2X/WEEK WITH LINZESS 290 MCG PRN BECAUSE SHE CAN'T AFFORD IT. NO DIFFERENCE WITH PROBIOTIC. PT DENIES FEVER, CHILLS, BRBPR, vomiting, melena, diarrhea, OR problems swallowing. HAVING A FLARE heartburn or indigestion.  Past Medical History  Diagnosis Date  . Reflux   . UTI (urinary tract infection)   . IBS (irritable bowel syndrome)   . Tubular adenoma of colon   . Gastroparesis   . Gastritis   . Endometriosis    Past Surgical History  Procedure Laterality Date  . Bilateral ear tubes  1993/1997  . Adenoidectomy  1997  . Ileocolonoscopy  02/09/2012    Fields:tubular adenoma removed from the sigmoid colon/Internal hemorrhoids. random colon bx negative.  . Esophagogastroduodenoscopy  02/09/2012    Fields:Mild gastritis/SYMPTOMS MOST LIKELY DUE TO IBS-D. SB bx negative.    Allergies  Allergen Reactions  . Cefzil [Cefprozil] Swelling    Swelling of throat also  . Red Dye    Current Outpatient Prescriptions  Medication Sig Dispense Refill  . acetaminophen (TYLENOL) 500 MG tablet Take 1,000 mg by mouth every 6 (six) hours as needed for pain.      . diazepam (VALIUM) 5 MG tablet Take 5 mg by mouth every 6 (six) hours as needed for anxiety.      Clelia Schaumann Estrad 91-Day (AMETHIA LO PO) Take by mouth daily.      . Linaclotide (LINZESS) 290 MCG MCG CAPS capsule Take 1 capsule  by mouth PRN    .  nortriptyline (PAMELOR) 10 MG capsule Take 10 mg by mouth FIVE AT BEDTIME. Pt takes five 10 mg pills at once daily      . oxyCODONE-acetaminophen (PERCOCET/ROXICET) 5-325 MG per tablet Take 1 tablet by mouth every 6 (six) hours as needed for pain.    . pantoprazole (PROTONIX) 40 MG tablet Take 40 mg by mouth ONE OR TWO TIMES daily.      .        . promethazine (PHENERGAN) 25 MG suppository OR TABLETS Place 1 suppository (25 mg total) rectally every 6 (six) hours as needed for nausea or vomiting. Please do not take with promethazine tablets. SUPP: IF NAUSEA WAS BAD TABLETS: Q4H   .        Marland Kitchen SUMAtriptan (IMITREX) 100 MG tablet Take 100 mg by mouth every 2 (two) hours as needed for migraine.      . citalopram (CELEXA) 10 MG tablet Take 10 mg by mouth daily. 1/2 tablet daily      .           Review of Systems     Objective:   Physical Exam  Vitals reviewed. Constitutional: She is oriented to person, place, and time. She appears well-nourished. No distress.  POOR HYGEINE  HENT:  Head: Normocephalic and atraumatic.  Mouth/Throat: Oropharynx is clear and moist. No oropharyngeal exudate.  Eyes: Pupils are equal, round, and reactive to  light. No scleral icterus.  Neck: Normal range of motion. Neck supple.  Cardiovascular: Normal rate, regular rhythm and normal heart sounds.   Pulmonary/Chest: Effort normal and breath sounds normal. No respiratory distress.  Abdominal: Soft. Bowel sounds are normal. She exhibits no distension. There is no tenderness.  Musculoskeletal: She exhibits no edema.  Lymphadenopathy:    She has no cervical adenopathy.  Neurological: She is alert and oriented to person, place, and time.  NO FOCAL DEFICITS   Psychiatric:  FLAT AFFECT, SLIGHTLY ANXIOUS MOOD          Assessment & Plan:

## 2013-06-28 NOTE — Progress Notes (Signed)
Pt is aware of OV on 4/17 at 10 with [facility] and appt card was mailed

## 2013-06-28 NOTE — Assessment & Plan Note (Addendum)
NAUSEA MANAGED WITH PRN MEDS AND DIET. SX NOT IDEALLY CONTROLLED. NOT SATISFIED WITH VISIT TO [facility]. SX EXACERBATED BY CHRONIC MIGRAINES.  PHENERGAN PRN. REFER TO [facility] FOR 3RD OPINION OPV IN 4 MOS

## 2013-06-28 NOTE — Patient Instructions (Addendum)
LIMIT PHENERGAN TO NO MORE THAN 4 A DAY.  FOLLOW A GASTROPARESIS DIET.  I WILL REFER YOU [facility] FOR 3RD OPINION.  FOLLOW UP IN 4 MOS.   CELEXA AND PHENERGAN DO INTERACT. CONTINUE PAMELOR.

## 2013-06-28 NOTE — Assessment & Plan Note (Addendum)
NAUSEA MANAGED WITH PRN MEDS AND DIET. SX NOT IDEALLY CONTROLLED. NOT SATISFIED WITH VISIT TO [facility].   PHENERGAN PRN. REFER TO [facility] FOR 3RD OPINION OPV IN 4 MOS

## 2013-06-28 NOTE — Assessment & Plan Note (Signed)
MIXED SYMPTOMS. CONSTIPATION TREATED WITH LINZESS.  CONTINUE LINZESS. SAMPLES 312 GIVEN. FOLLOW A GASTROPARESIS DIET. OPV IN 6 MOS

## 2013-06-28 NOTE — Progress Notes (Signed)
REVIEWED.  

## 2013-06-29 NOTE — Progress Notes (Signed)
No PCP 

## 2013-06-29 NOTE — Progress Notes (Signed)
She is scheduled for May 7th at 9:00 am with Johnsie Kindred and she is aware

## 2013-06-29 NOTE — Progress Notes (Signed)
Referral has been sent to [facility] for 3rd Opinion

## 2013-07-03 ENCOUNTER — Telehealth: Payer: Self-pay

## 2013-07-03 MED ORDER — PROMETHAZINE HCL 25 MG PO TABS: 25.0000 mg | ORAL_TABLET | ORAL | Status: DC | PRN

## 2013-07-03 NOTE — Telephone Encounter (Signed)
done

## 2013-07-03 NOTE — Telephone Encounter (Signed)
Pt called and said her Phenergan tablets does not have any refills. She would like prescription sent to [facility]. ( At this time she has 11 tablets left, and she said that will last her about 3 days. She wanted to call in advance because of the holidays).   She is in [place] and will be there until after the first of the New Year, but she said she can get it changed to another pharmacy from [facility].

## 2013-09-21 ENCOUNTER — Other Ambulatory Visit: Payer: Self-pay | Admitting: Gastroenterology

## 2013-09-29 ENCOUNTER — Other Ambulatory Visit: Payer: Self-pay | Admitting: Gastroenterology

## 2013-10-02 ENCOUNTER — Telehealth: Payer: Self-pay | Admitting: *Deleted

## 2013-10-02 NOTE — Telephone Encounter (Signed)
Called pt and told her the prescription for the phenergan was sent in on 09/29/2013 and receipt confirmed by pharmacy at 2:29 pm on Friday. She will call them and if there is a problem she will let us know.

## 2013-10-02 NOTE — Telephone Encounter (Signed)
Pt called stating her [place] has sent over a renewal on her finnigrin pt has been on straight liquids because she is out. Please advise [phone]

## 2013-10-27 ENCOUNTER — Ambulatory Visit: Payer: BC Managed Care – PPO | Admitting: Gastroenterology

## 2013-11-16 ENCOUNTER — Other Ambulatory Visit: Payer: Self-pay | Admitting: Gastroenterology

## 2013-11-29 ENCOUNTER — Encounter: Payer: Self-pay | Admitting: Gastroenterology

## 2013-11-29 ENCOUNTER — Ambulatory Visit (INDEPENDENT_AMBULATORY_CARE_PROVIDER_SITE_OTHER): Payer: Federal, State, Local not specified - PPO | Admitting: Gastroenterology

## 2013-11-29 ENCOUNTER — Encounter (INDEPENDENT_AMBULATORY_CARE_PROVIDER_SITE_OTHER): Payer: Self-pay

## 2013-11-29 VITALS — BP 122/74 | HR 91 | Temp 97.9°F | Ht 66.0 in | Wt 157.2 lb

## 2013-11-29 DIAGNOSIS — K589 Irritable bowel syndrome without diarrhea: Secondary | ICD-10-CM

## 2013-11-29 DIAGNOSIS — R112 Nausea with vomiting, unspecified: Secondary | ICD-10-CM

## 2013-11-29 MED ORDER — PROMETHAZINE HCL 25 MG PO TABS: ORAL_TABLET | ORAL | Status: DC

## 2013-11-29 NOTE — Progress Notes (Signed)
Primary Care Physician: PROVIDER NOT IN SYSTEM  Primary Gastroenterologist:  Barney Drain, MD   Chief Complaint  Patient presents with  . Follow-up    HPI: Hailey Parker is a 28 y.o. female here for f/u. 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  Gained 17 pounds since last OV in 06/2013. She states she had gained more on Lupron but was able to loose some of the weight.   Seen at [facility] 5//15 in GI department.   ASSESSMENT AND PLAN: Dr. Lula Parker Hailey Parker is a 28 y.o. female presenting for an initial consultation. She had IBS symptoms prior to the onset of worsening nausea, abdominal pain, headaches and fatigue. 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 She has a number of symptoms currently. She feels that her IBS symptoms are reasonably managed with her gastroparesis diet. The main concern if that symptoms reappear with migraines. She is working with the pain clinic now. I feel that this is the best option. She most likely does not have gastroparesis, but instead more IBS/functional pain with nausea. She has tried the typical therapies including tricyclic antidepressants and even Remeron.   I discussed that post-infectious IBS typically improves within 24 months. She clearly has a number of other symptoms - and I am not convinced this rule will hold true.   I agree with biofeedback and even discussed that if symptoms do not respond - she could consider integrative medicine to help.   1. Continue gastroparesis diet.  2. I would not recommend further testing or repeat of gastric emptying study.   Previously she had the following: 05/2013: GES at [facility]: Normal 2 hour study with 12% remaining activity at two hours. 11/2012: GES at [facility]: 26% retention at two hours, 7 % at 3 hours, 3 % at 4 hours. Normal study. 08/2012: GES at [facility]: 47% retention at 2 hours, abnormal.  Patient reports overall she has noted some improvement in nausea,  left sided abdominal pain, BMs. Has learned to live with it. Migraines are her biggest problem. She has applied to disability. She is unable to drive related to HAs and vertigo symptoms. Upcoming appointment finally at [facility] July 20th. Psychologist, June 9th. Biofeedback in 12/2013.   Constant nausea. Rare days of no nausea. Phenergan every four hours. If taking valium (vertigo), skips Phenergan. Has terrible motion sickness in the car. Currently off all birth control meds due to lack of response with regards to menstrual bleeding and pain. Currently awaiting for couple of cycles before considering started new medication. She is contemplating pregnancy in the not to distant future if physicians on board with her.   Takes Linzess only couple of times per month. Allergy testing negative but only tested 9 things per patient. Done in [place]. Sister with some food allergy including seafood, black pepper, coffee, lettuce.   BM still c/d. More regular at least. Every 2-3 days. No melena, brbpr. Some [place] 2-3 to 6. Some intermittent heartburn but for most part okay. Occasionally BID about one week a month.   Current Outpatient Prescriptions  Medication Sig Dispense Refill  . acetaminophen (TYLENOL) 500 MG tablet Take 1,000 mg by mouth every 6 (six) hours as needed for pain.      . diazepam (VALIUM) 5 MG tablet Take 5 mg by mouth every 6 (six) hours as needed for anxiety.      . Gabapentin (NEURONTIN PO) Take 1 tablet by mouth  daily.      . Linaclotide (LINZESS) 145 MCG CAPS capsule Take 290 mcg by mouth daily.      . Magnesium 250 MG TABS Take 250 mg by mouth daily.      . Multiple Vitamin (MULTIVITAMIN) capsule Take 1 capsule by mouth daily.      . naratriptan (AMERGE) 2.5 MG tablet Take 2.5 mg by mouth as needed for migraine. Take one (1) tablet at onset of headache; if returns or does not resolve, may repeat after 4 hours; do not exceed five (5) mg in 24 hours.      Marland Kitchen  oxyCODONE-acetaminophen (PERCOCET/ROXICET) 5-325 MG per tablet Take 1 tablet by mouth every 6 (six) hours as needed for pain.  20 tablet  0  . pantoprazole (PROTONIX) 40 MG tablet Take 40 mg by mouth daily.      . promethazine (PHENERGAN) 25 MG tablet TAKE (1) TABLET BY MOUTH EVERY FOUR HOURS AS NEEDED FOR NAUSEA AND VOM ITING.  60 tablet  1  . propranolol (INDERAL) 40 MG tablet Take 40 mg by mouth 3 (three) times daily.      . traZODone (DESYREL) 100 MG tablet Take 100 mg by mouth at bedtime.       No current facility-administered medications for this visit.    Allergies as of 11/29/2013 - Review Complete 11/29/2013  Allergen Reaction Noted  . Cefzil [cefprozil] Swelling 01/04/2012  . Red dye  09/08/2012    ROS:  General: Negative for fever, chills. +weakness, weight gain on Lupron but losing some.  ENT: Negative for hoarseness, difficulty swallowing , nasal congestion. CV: Negative for chest pain, angina, palpitations, dyspnea on exertion, peripheral edema.  Respiratory: Negative for dyspnea at rest, dyspnea on exertion, cough, sputum, wheezing.  GI: See history of present illness. GU:  Negative for dysuria, hematuria, urinary incontinence, urinary frequency, nocturnal urination.  Endo: Negative for unusual weight change.    Physical Examination:   BP 122/74  Pulse 91  Temp(Src) 97.9 F (36.6 C) (Oral)  Ht 5\' 6"  (1.676 m)  Wt 157 lb 3.2 oz (71.305 kg)  BMI 25.38 kg/m2  LMP 10/30/2013  General: Well-nourished, well-developed in no acute distress.  Eyes: No icterus. Mouth: Oropharyngeal mucosa moist and pink , no lesions erythema or exudate. Lungs: Clear to auscultation bilaterally.  Heart: Regular rate and rhythm, no murmurs rubs or gallops.  Abdomen: Bowel sounds are normal, nontender, nondistended, no hepatosplenomegaly or masses, no abdominal bruits or hernia , no rebound or guarding.   Extremities: No lower extremity edema. No clubbing or deformities. Neuro: Alert and  oriented x 4   Skin: Warm and dry, no jaundice.   Psych: Alert and cooperative, normal mood and affect.

## 2013-11-29 NOTE — Assessment & Plan Note (Signed)
Mixed symptoms. Linzess prn constipation about twice monthly. Overall BMs more regular. Some left sided abdominal pain which improves with BM. C/o bloating, chronically. Keep upcoming appts at [facility]. OV here in six months or sooner if needed.

## 2013-11-29 NOTE — Assessment & Plan Note (Signed)
N/V managed with phenergan and diet. Takes phenergan daily. Vomiting about twice per month but complains of constant nausea. Some days worse than others. Symptoms worsened by chronic migraines. Most recent GES normal at 2 hours in 05/2013. No further recommendations from [facility] except for biofeedback and/or integrated medicine.   Keep appt for biofeedback therapy and with psychology/pain manangement.  Ov in 6 months or sooner if needed.

## 2013-11-29 NOTE — Patient Instructions (Signed)
1. I will be in touch regarding questions about pregnancy after I discuss with Dr. Oneida Alar.  2. Avoid Linzess if you are not preventing pregnancy. 3. Continue forward with pain management and biofeedback. We will keep tabs on your progress via EPIC. 4. Return to office to see Dr. Oneida Alar in 05/2014. Call sooner if needed.

## 2013-12-06 NOTE — Progress Notes (Signed)
No PCP 

## 2013-12-20 ENCOUNTER — Telehealth: Payer: Self-pay | Admitting: Gastroenterology

## 2013-12-20 NOTE — Telephone Encounter (Signed)
Please let patient know that I discussed with [facility] her questions about possible pregnancy in setting of her GI issues.   Dr. Oneida Alar said that we could make recommendations about pregnancy category of GI drugs but that's about all, patient really should discuss matter with gyn. 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   Continue follow up with [facility] as planned. Consider getting their input as well.

## 2013-12-21 NOTE — Telephone Encounter (Signed)
Called and informed pt.  

## 2014-03-28 ENCOUNTER — Other Ambulatory Visit: Payer: Self-pay

## 2014-03-29 MED ORDER — PROMETHAZINE HCL 25 MG PO TABS: 25.0000 mg | ORAL_TABLET | Freq: Three times a day (TID) | ORAL | Status: DC | PRN

## 2014-04-21 ENCOUNTER — Other Ambulatory Visit: Payer: Self-pay | Admitting: Gastroenterology

## 2014-04-26 ENCOUNTER — Encounter: Payer: Self-pay | Admitting: Gastroenterology

## 2014-05-25 ENCOUNTER — Other Ambulatory Visit: Payer: Self-pay | Admitting: Gastroenterology

## 2014-08-02 ENCOUNTER — Ambulatory Visit: Payer: Self-pay | Admitting: Gastroenterology

## 2014-08-16 ENCOUNTER — Ambulatory Visit (INDEPENDENT_AMBULATORY_CARE_PROVIDER_SITE_OTHER): Payer: BLUE CROSS/BLUE SHIELD | Admitting: Gastroenterology

## 2014-08-16 ENCOUNTER — Encounter: Payer: Self-pay | Admitting: Gastroenterology

## 2014-08-16 VITALS — BP 103/62 | HR 87 | Temp 97.1°F | Ht 65.0 in | Wt 165.0 lb

## 2014-08-16 DIAGNOSIS — K589 Irritable bowel syndrome without diarrhea: Secondary | ICD-10-CM | POA: Diagnosis not present

## 2014-08-16 DIAGNOSIS — R112 Nausea with vomiting, unspecified: Secondary | ICD-10-CM

## 2014-08-16 MED ORDER — LUBIPROSTONE 8 MCG PO CAPS: ORAL_CAPSULE | ORAL | Status: AC

## 2014-08-16 MED ORDER — PROMETHAZINE HCL 25 MG PO TABS: ORAL_TABLET | ORAL | Status: DC

## 2014-08-16 MED ORDER — PANTOPRAZOLE SODIUM 40 MG PO TBEC: DELAYED_RELEASE_TABLET | ORAL | Status: AC

## 2014-08-16 NOTE — Addendum Note (Signed)
Addended by: Danie Binder on: 08/16/2014 10:52 AM   Modules accepted: Orders

## 2014-08-16 NOTE — Assessment & Plan Note (Signed)
Sx controlled with PHENERGAN. WEIGHT GAINED SINCE MAY 0215.  CONTINUE TO MONITOR SYMPTOMS. PHENERGAN TID PRN FOR NAUSEA AND VOMITING FOLLOW UP IN 6 MOS.

## 2014-08-16 NOTE — Progress Notes (Signed)
ON RECALL LIST  °

## 2014-08-16 NOTE — Progress Notes (Signed)
REVIEWED. AGREE. NO ADDITIONAL RECOMMENDATIONS. 

## 2014-08-16 NOTE — Assessment & Plan Note (Addendum)
MIXED SYMPTOMS NOT IDEALLY CONTROLLED.  USE LINZESS OR AMITIZA TO PREVENT CONSTIPATION. AMITIZA SAMPLES GIVEN.  CONTINUE DIET AS TOLERATED.   CONTINUE PROTONIX. TAKE 30 MINUTES PRIOR TO MEALS TWICE DAILY.  DRINK WATER TO KEEP YOUR URINE LIGHT YELLOW.  FOLLOW UP IN 6 MOS.

## 2014-08-16 NOTE — Patient Instructions (Addendum)
USE PHENERGAN AS NEEDED FOR NAUSEA AND VOMITING.  USE LINZESS OR AMITIZA TO PREVENT CONSTIPATION. USE AMITIZA 8 MCG AT BEDTIMEFOR 7 DAYS THEN INCREASE TO TWICE DAILY WITH MEALS. CALL IN ONE MONTH IF CONSTIPATION IS NOT BETTER.  CONTINUE DIET AS TOLERATED. YOUR WEIGHT IS UP 7 LBS SINCE MAY 2015. YOU BODY MASS INDEX IS 27(OVERWEIGHT). YOU SHOULD TRY TO GET IT BACK NEAR 25.  CONTINUE PROTONIX. TAKE 30 MINUTES PRIOR TO MEALS TWICE DAILY.  DRINK WATER TO KEEP YOUR URINE LIGHT YELLOW.  FOLLOW UP IN 6 MOS.

## 2014-08-16 NOTE — Progress Notes (Signed)
Subjective:    Patient ID: Hailey Parker, female    DOB: 30-Oct-1985, 29 y.o.   MRN: [id number]  PROVIDER NOT IN SYSTEM  HPI NEEDS REFILLS ON PROTONIX AND PHENERGAN. NAUSEA: DAILY REQUIRES PHENERGAN 3X/DAY. VOMITS: 4/7 DAYS. CAN BE WORSE WHEN RECOVERING FROM A  MIGRAINE, BUT MOSTLY ONCE A DAY. BMS: 3 DAYS A WEEK(EITHER A #1 OR #6 OR 7). IN PELVIC FLOOR THERAPY SINCE AUG 2015. ONE DAY SAW SPECKS OF BLOOD IN EMESIS AFTER VOMITING 3-4X. ABD HURTS ALL THE TIME AND CARBS DON'T HURT AS MUCH. PROTONIX HELPS BUT 2X/WEEK AT [place].   PT DENIES FEVER, CHILLS, HEMATOCHEZIA, melena, diarrhea, CHEST PAIN, SHORTNESS OF BREATH,  problems swallowing, heartburn or indigestion.  Past Medical History  Diagnosis Date  . Reflux   . UTI (urinary tract infection)   . IBS (irritable bowel syndrome)   . Tubular adenoma of colon   . Gastroparesis   . Gastritis   . Endometriosis    Past Surgical History  Procedure Laterality Date  . Bilateral ear tubes  1993/1997  . Adenoidectomy  1997  . Ileocolonoscopy  02/09/2012    Hailey Parker:tubular adenoma removed from the sigmoid colon/Internal hemorrhoids. random colon bx negative.  . Esophagogastroduodenoscopy  02/09/2012    Hailey Parker:Mild gastritis/SYMPTOMS MOST LIKELY DUE TO IBS-D. SB bx negative.    Allergies  Allergen Reactions  . Cefzil [Cefprozil] Swelling    Swelling of throat also  . Red Dye    Current Outpatient Prescriptions  Medication Sig Dispense Refill  . acetaminophen (TYLENOL) 500 MG tablet Take 1,000 mg by mouth every 6 (six) hours as needed for pain.    . diazepam (VALIUM) 5 MG tablet Take 5 mg by mouth every 6 (six) hours as needed for anxiety.    . Gabapentin (NEURONTIN PO) Take 1 tablet by mouth daily.    . Linaclotide (LINZESS) 145 MCG CAPS capsule Take 290 mcg by mouth daily. TAKES QOD DUE TOCOST   . Magnesium 250 MG TABS Take 250 mg by mouth daily.    . Multiple Vitamin (MULTIVITAMIN) capsule Take 1 capsule by mouth daily.    .  naratriptan (AMERGE) 2.5 MG tablet Take 2.5 mg by mouth as needed for migraine. Take one (1) tablet at onset of headache; if returns or does not resolve, may repeat after 4 hours; do not exceed five (5) mg in 24 hours.    Marland Kitchen oxyCODONE-acetaminophen (PERCOCET/ROXICET) 5-325 MG per tablet Take 1 tablet by mouth every 6 (six) hours as needed for pain.    . pantoprazole (PROTONIX) 40 MG tablet TAKE (1) TABLET BY MOUTH TWICE A DAY BEFORE MEALS. (BREAKFAST AND SUPPER)    . promethazine (PHENERGAN) 25 MG tablet TAKE 1 TABLET EVERY 8 HOURS AS NEEDED FOR NAUSEA & VOMITING    . propranolol (INDERAL) 40 MG tablet Take 40 mg by mouth 3 (three) times daily.    . tizanidine (ZANAFLEX) 2 MG capsule Take 4 mg by mouth daily.    .        Review of Systems     Objective:   Physical Exam  Constitutional: She is oriented to person, place, and time. She appears well-developed and well-nourished. No distress.  HENT:  Head: Normocephalic and atraumatic.  Mouth/Throat: Oropharynx is clear and moist. No oropharyngeal exudate.  Eyes: Pupils are equal, round, and reactive to light. No scleral icterus.  Neck: Normal range of motion. Neck supple.  Cardiovascular: Normal rate, regular rhythm and normal heart sounds.   Pulmonary/Chest: Effort  normal and breath sounds normal. No respiratory distress.  Abdominal: Soft. Bowel sounds are normal. She exhibits no distension. There is no tenderness.  Musculoskeletal: She exhibits no edema.  Lymphadenopathy:    She has no cervical adenopathy.  Neurological: She is alert and oriented to person, place, and time.  NO FOCAL DEFICITS   Psychiatric:  FLAT AFFECT, NL MOOD   Vitals reviewed.         Assessment & Plan:

## 2014-09-01 NOTE — Progress Notes (Signed)
NOT PCP PER PT

## 2014-09-10 ENCOUNTER — Other Ambulatory Visit: Payer: Self-pay

## 2014-09-11 MED ORDER — PROMETHAZINE HCL 25 MG PO TABS: ORAL_TABLET | ORAL | Status: DC

## 2014-09-20 IMAGING — US US ABDOMEN LIMITED
1 series · 14 of 25 positions shown · non-contrast
Comparison: None

Correlation:  CT abdomen and pelvis 09/12/2012

CLINICAL DATA: Nausea and vomiting

EXAM:
US ABDOMEN LIMITED - RIGHT UPPER QUADRANT

[Series 1: us abdomen limited · 0.20mm/px · 14 of 48 slices shown]
[im 1/48]
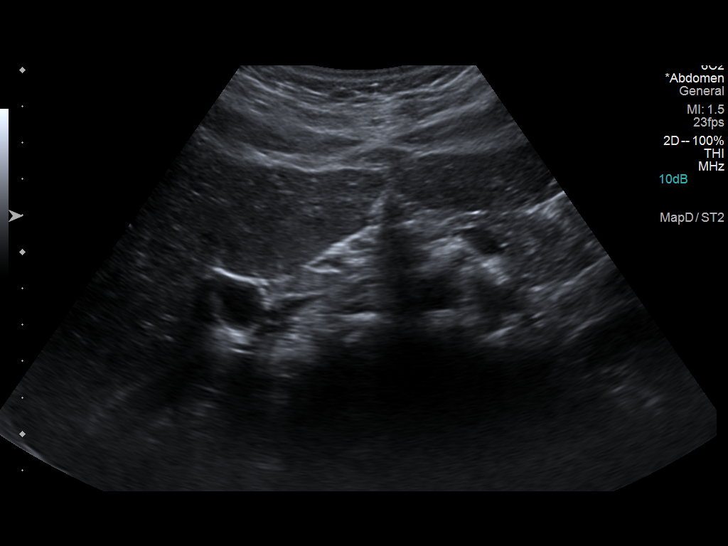
[im 4/48]
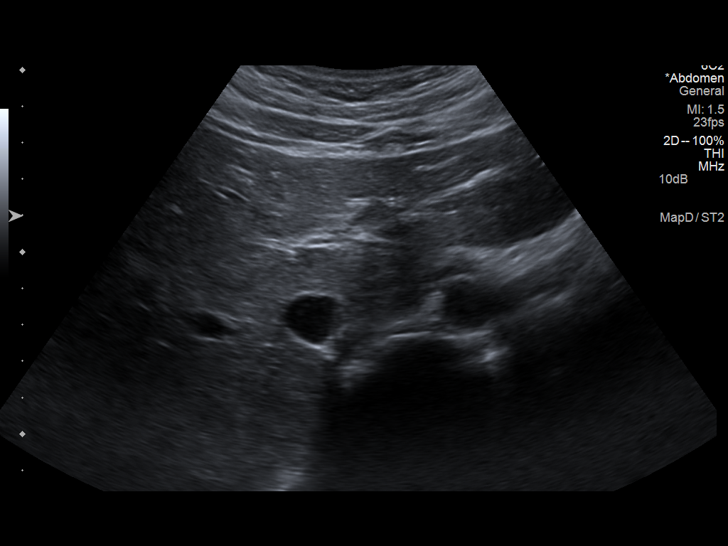
[im 8/48]
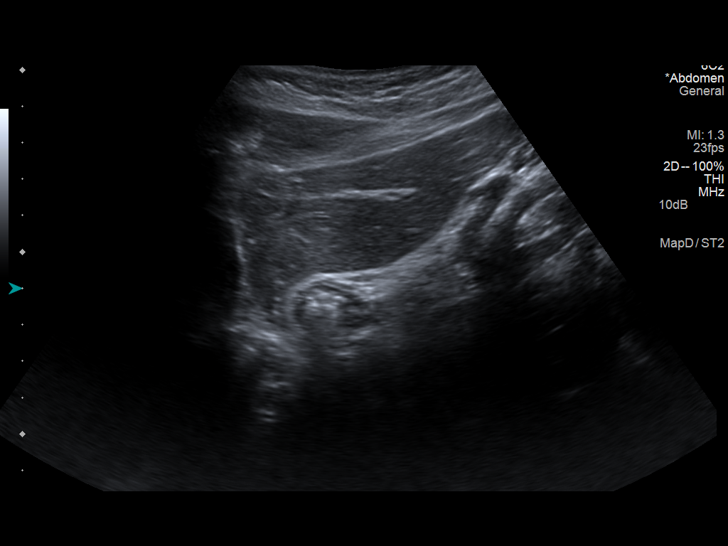
[im 12/48]
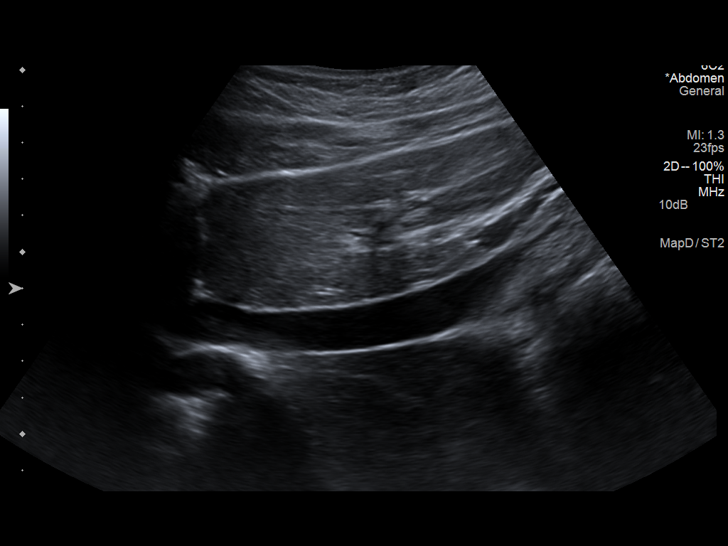
[im 16/48]
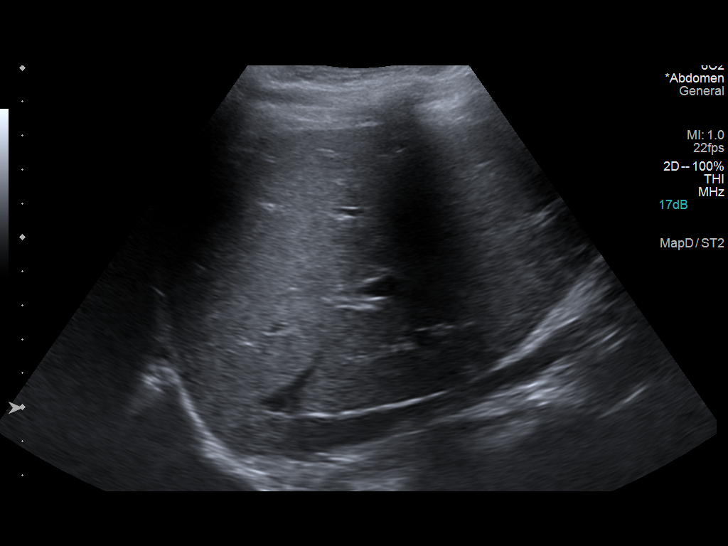
[im 18/48]
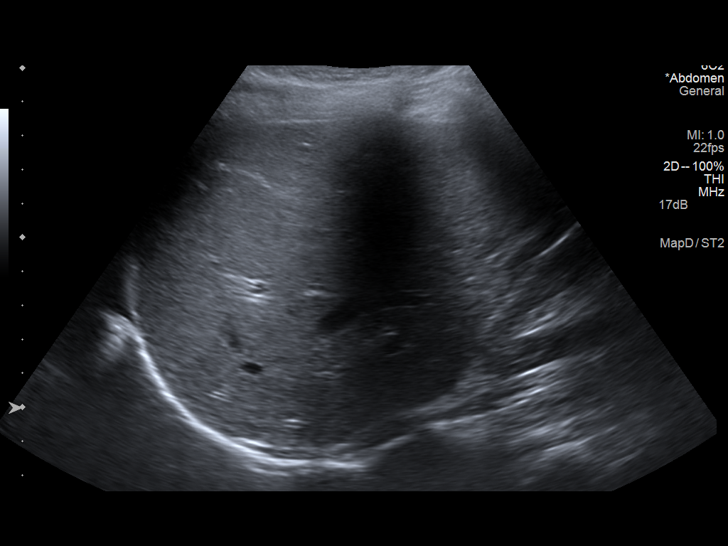
[im 22/48]
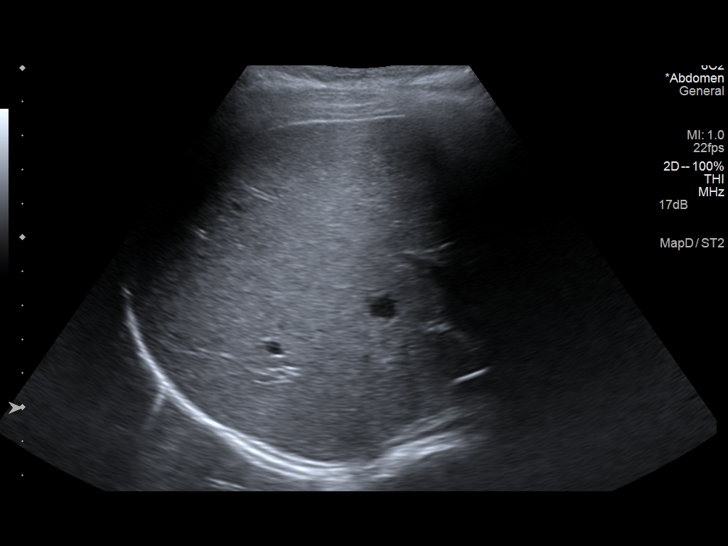
[im 26/48]
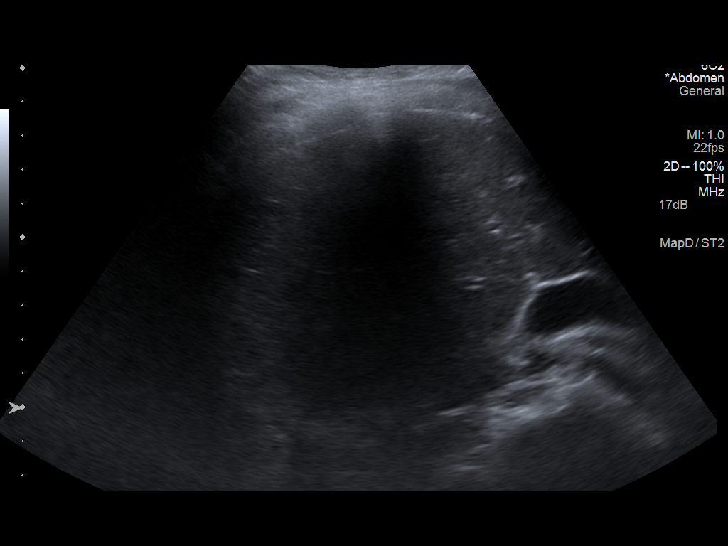
[im 30/48]
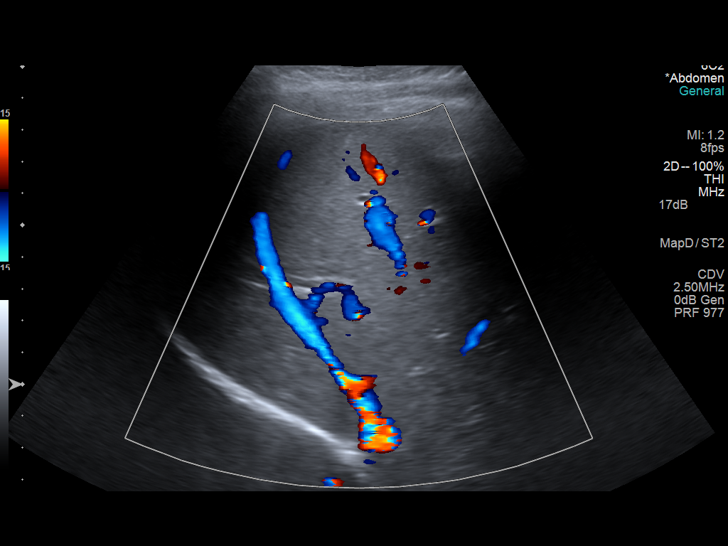
[im 32/48]
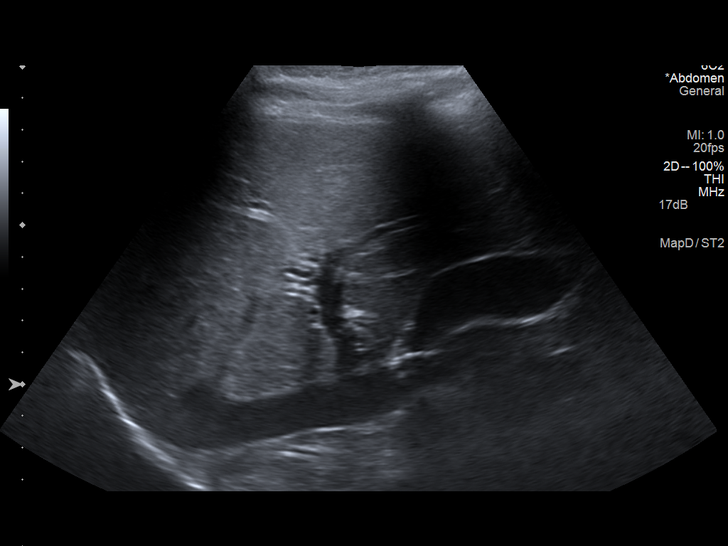
[im 36/48]
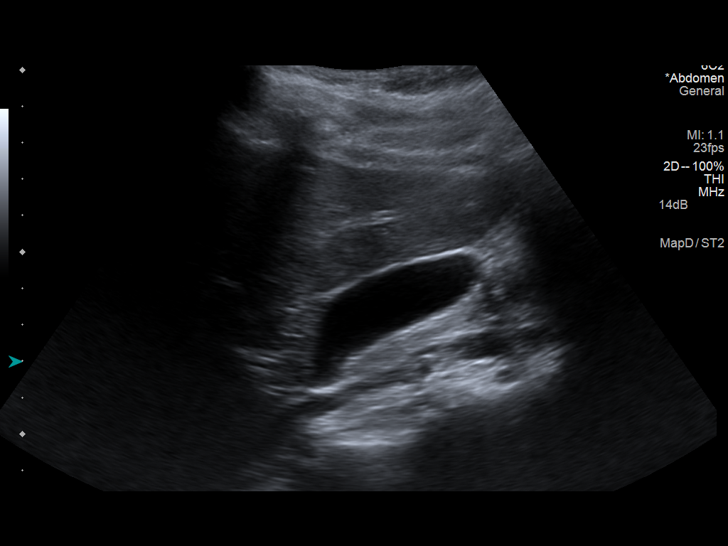
[im 40/48]
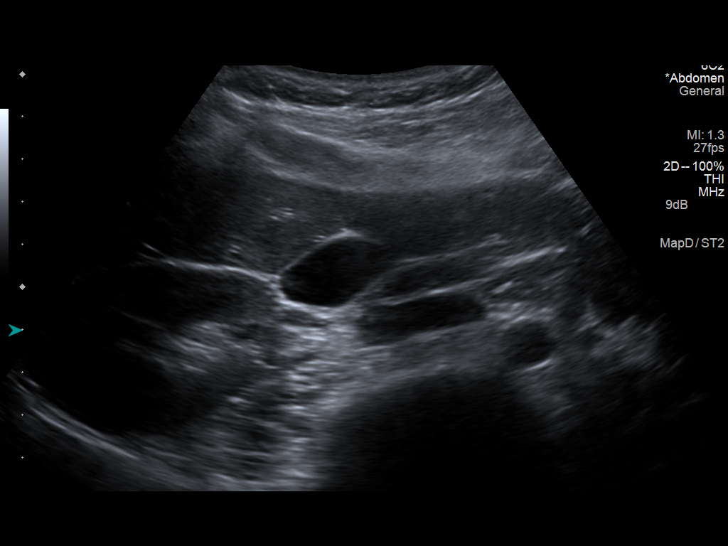
[im 44/48]
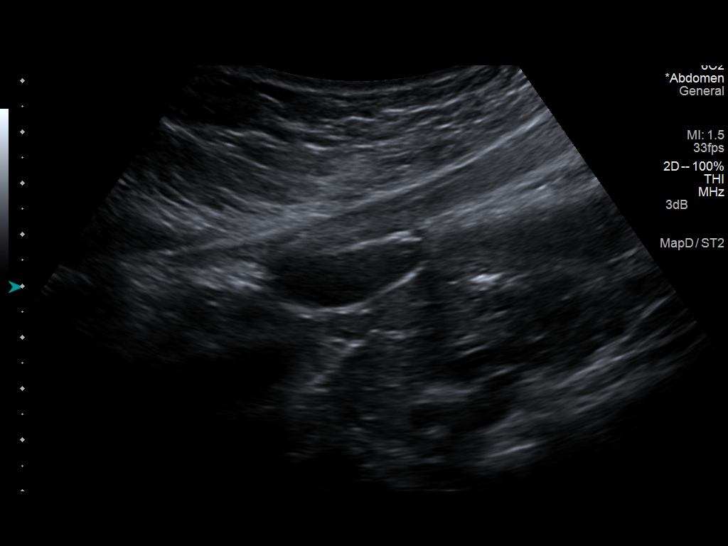
[im 48/48]
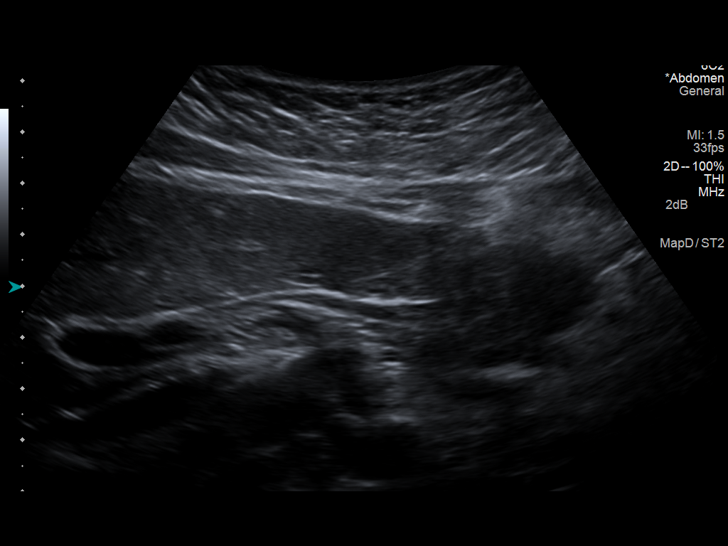

[14 of 25 positions shown; findings below may reference images not displayed]

FINDINGS: Gallbladder

Normally distended without stones or wall thickening.

No pericholecystic fluid or sonographic Murphy sign.

Common bile duct

Diameter: Normal caliber 4 mm diameter

Liver:

Normal appearance.  Hepatopetal portal venous flow.

No free-fluid noted in right upper quadrant.
IMPRESSION: Normal right upper quadrant ultrasound.

## 2015-01-16 ENCOUNTER — Encounter: Payer: Self-pay | Admitting: Gastroenterology

## 2015-06-26 ENCOUNTER — Other Ambulatory Visit: Payer: Self-pay | Admitting: Gastroenterology

## 2017-05-18 ENCOUNTER — Telehealth: Payer: Self-pay | Admitting: Gastroenterology

## 2017-05-18 NOTE — Telephone Encounter (Signed)
REVIEWED-NO ADDITIONAL RECOMMENDATIONS. 

## 2017-05-18 NOTE — Telephone Encounter (Signed)
Patient has moved to [place] and I have mailed her a release of records to sign and mail back to [place]. She is aware that [facility] will be processing those records.

## 2017-05-18 NOTE — Telephone Encounter (Signed)
Noted
